# Patient Record
Sex: Female | Born: 1980 | Race: White | Hispanic: No | Marital: Married | Smoking: Never smoker
Health system: Southern US, Community
[De-identification: ages and names within clinical notes are randomized; demographics above are authoritative.]

## PROBLEM LIST (undated history)

## (undated) DIAGNOSIS — E079 Disorder of thyroid, unspecified: Secondary | ICD-10-CM

## (undated) DIAGNOSIS — N200 Calculus of kidney: Secondary | ICD-10-CM

## (undated) DIAGNOSIS — F419 Anxiety disorder, unspecified: Secondary | ICD-10-CM

## (undated) DIAGNOSIS — E669 Obesity, unspecified: Secondary | ICD-10-CM

## (undated) DIAGNOSIS — Z9889 Other specified postprocedural states: Secondary | ICD-10-CM

## (undated) DIAGNOSIS — M1990 Unspecified osteoarthritis, unspecified site: Secondary | ICD-10-CM

## (undated) DIAGNOSIS — E039 Hypothyroidism, unspecified: Secondary | ICD-10-CM

## (undated) DIAGNOSIS — R112 Nausea with vomiting, unspecified: Secondary | ICD-10-CM

## (undated) DIAGNOSIS — K8020 Calculus of gallbladder without cholecystitis without obstruction: Secondary | ICD-10-CM

## (undated) DIAGNOSIS — R002 Palpitations: Secondary | ICD-10-CM

## (undated) HISTORY — DX: Palpitations: R00.2

## (undated) HISTORY — DX: Obesity, unspecified: E66.9

## (undated) HISTORY — DX: Hypothyroidism, unspecified: E03.9

## (undated) HISTORY — DX: Anxiety disorder, unspecified: F41.9

## (undated) HISTORY — DX: Calculus of kidney: N20.0

## (undated) HISTORY — DX: Calculus of gallbladder without cholecystitis without obstruction: K80.20

## (undated) HISTORY — PX: DILATION AND CURETTAGE OF UTERUS: SHX78

## (undated) HISTORY — PX: GANGLION CYST EXCISION: SHX1691

## (undated) HISTORY — DX: Disorder of thyroid, unspecified: E07.9

---

## 2008-07-03 HISTORY — PX: OTHER SURGICAL HISTORY: SHX169

## 2008-07-03 HISTORY — PX: OOPHORECTOMY: SHX86

## 2013-03-03 HISTORY — PX: BREAST REDUCTION SURGERY: SHX8

## 2015-09-28 ENCOUNTER — Encounter: Payer: Self-pay | Admitting: *Deleted

## 2015-09-28 ENCOUNTER — Other Ambulatory Visit: Payer: Self-pay | Admitting: *Deleted

## 2015-10-01 ENCOUNTER — Encounter: Payer: Self-pay | Admitting: *Deleted

## 2015-10-01 ENCOUNTER — Encounter: Payer: Self-pay | Admitting: Cardiology

## 2015-10-01 ENCOUNTER — Ambulatory Visit (INDEPENDENT_AMBULATORY_CARE_PROVIDER_SITE_OTHER): Payer: BLUE CROSS/BLUE SHIELD | Admitting: Cardiology

## 2015-10-01 VITALS — BP 128/88 | HR 80 | Ht 63.0 in | Wt 166.0 lb

## 2015-10-01 DIAGNOSIS — Z136 Encounter for screening for cardiovascular disorders: Secondary | ICD-10-CM | POA: Diagnosis not present

## 2015-10-01 DIAGNOSIS — R002 Palpitations: Secondary | ICD-10-CM

## 2015-10-01 NOTE — Progress Notes (Signed)
Patient ID: Kathy Martin, female   DOB: 20-Jan-1981, 35 y.o.   MRN: [id number]     Clinical Summary Kathy Martin is a 35 y.o.female seen today as a new patient, she is referred by Dr Selinda Flavin.  1. Palpitations - 3 episodes over the 2 months - feeling of heart pounding, feels lightheaded, and feels anxious. Lasts 5-10 minutes, fatigued for the rest day - 2 episodes at work, 3rd epsisode at home while making a sandwich. Last episode about 2 week ago - 1 cup coffee in AM, no tea, no sodas, no energy drinks. 2-3 days a week, vodka x 1-2 shots.  - on xanax as needed, has long term anxiety.  - she reports normal blood work recently including normal TSH.     Past Medical History  Diagnosis Date  . Hypothyroidism   . Anxiety disorder   . Palpitations   . Palpitations   . Hypothyroidism   . Anxiety      No Known Allergies   Current Outpatient Prescriptions  Medication Sig Dispense Refill  . ALPRAZolam (XANAX) 0.25 MG tablet Take 0.25 mg by mouth daily as needed for anxiety.    . chlorpheniramine-HYDROcodone (TUSSIONEX PENNKINETIC ER) 10-8 MG/5ML SUER Take 5 mLs by mouth.    . lamoTRIgine (LAMICTAL) 100 MG tablet Take 1 tablet by mouth daily.  6  . levothyroxine (SYNTHROID, LEVOTHROID) 50 MCG tablet Take 50 mcg by mouth daily before breakfast.     No current facility-administered medications for this visit.     Past Surgical History  Procedure Laterality Date  . Breast reduction surgery  03/2013     No Known Allergies    Family History Patient denies any family history of heart disease.    Social History Kathy Martin reports that she has never smoked. She does not have any smokeless tobacco history on file. Kathy Martin has no alcohol history on file.   Review of Systems CONSTITUTIONAL: No weight loss, fever, chills, weakness or fatigue.  HEENT: Eyes: No visual loss, blurred vision, double vision or yellow sclerae.No hearing loss, sneezing, congestion, runny nose or  sore throat.  SKIN: No rash or itching.  CARDIOVASCULAR: per HPI RESPIRATORY: No shortness of breath, cough or sputum.  GASTROINTESTINAL: No anorexia, nausea, vomiting or diarrhea. No abdominal pain or blood.  GENITOURINARY: No burning on urination, no polyuria NEUROLOGICAL: No headache, dizziness, syncope, paralysis, ataxia, numbness or tingling in the extremities. No change in bowel or bladder control.  MUSCULOSKELETAL: No muscle, back pain, joint pain or stiffness.  LYMPHATICS: No enlarged nodes. No history of splenectomy.  PSYCHIATRIC: No history of depression or anxiety.  ENDOCRINOLOGIC: No reports of sweating, cold or heat intolerance. No polyuria or polydipsia.  Marland Kitchen   Physical Examination Filed Vitals:   10/01/15 0845  BP: 128/88  Pulse: 80   Filed Vitals:   10/01/15 0845  Height:  (1.6 m)  Weight: 166 lb (75.297 kg)    Gen: resting comfortably, no acute distress HEENT: no scleral icterus, pupils equal round and reactive, no palptable cervical adenopathy,  CV: RRR, no m/r/g, no jvd Resp: Clear to auscultation bilaterally GI: abdomen is soft, non-tender, non-distended, normal bowel sounds, no hepatosplenomegaly MSK: extremities are warm, no edema.  Skin: warm, no rash Neuro:  no focal deficits Psych: appropriate affect     Assessment and Plan  1. Palpitations - ekg in clinic shows normal sinus rhythm - we will obtain a 3 week event monitor to evaluate for any potential arrhythmias.  -  decrease caffeine and EtOH use.   F/u 6 weeks    Antoine PocheJonathan F. Rihanna Marseille, M.D.

## 2015-10-01 NOTE — Patient Instructions (Signed)
Your physician recommends that you schedule a follow-up appointment in: 6 weeks with Dr. Wyline MoodBranch  Your physician recommends that you continue on your current medications as directed. Please refer to the Current Medication list given to you today.  Your physician has recommended that you wear an event monitor FOR 21 DAYS. Event monitors are medical devices that record the heart's electrical activity. Doctors most often us these monitors to diagnose arrhythmias. Arrhythmias are problems with the speed or rhythm of the heartbeat. The monitor is a small, portable device. You can wear one while you do your normal daily activities. This is usually used to diagnose what is causing palpitations/syncope (passing out).  Thank you for choosing [facility]!!

## 2015-10-06 ENCOUNTER — Ambulatory Visit (INDEPENDENT_AMBULATORY_CARE_PROVIDER_SITE_OTHER): Payer: BLUE CROSS/BLUE SHIELD

## 2015-10-06 DIAGNOSIS — R002 Palpitations: Secondary | ICD-10-CM | POA: Diagnosis not present

## 2015-11-02 ENCOUNTER — Telehealth: Payer: Self-pay | Admitting: *Deleted

## 2015-11-02 NOTE — Telephone Encounter (Signed)
-----   Message from Antoine PocheJonathan F Branch, MD sent at 10/29/2015  3:54 PM EDT ----- Heart monitor shows no significant abnormal rhythms. We will discuss in detail at our follow up. How are her symptoms doing?  J BrancH MD

## 2015-11-02 NOTE — Telephone Encounter (Signed)
Pt called back and says she does still have episodes of where she cannot catch her breath, denies any chest pain or discomfort/dizziness. Pt confirmed appt for Friday.

## 2015-11-05 ENCOUNTER — Ambulatory Visit (INDEPENDENT_AMBULATORY_CARE_PROVIDER_SITE_OTHER): Payer: BLUE CROSS/BLUE SHIELD | Admitting: Cardiology

## 2015-11-05 ENCOUNTER — Encounter: Payer: Self-pay | Admitting: Cardiology

## 2015-11-05 ENCOUNTER — Ambulatory Visit: Payer: BLUE CROSS/BLUE SHIELD | Admitting: Cardiology

## 2015-11-05 VITALS — BP 108/73 | HR 74 | Ht 63.0 in | Wt 168.0 lb

## 2015-11-05 DIAGNOSIS — R002 Palpitations: Secondary | ICD-10-CM

## 2015-11-05 NOTE — Patient Instructions (Signed)
Your physician recommends that you schedule a follow-up appointment AS NEEDED WITH DR. BRANCH  Your physician recommends that you continue on your current medications as directed. Please refer to the Current Medication list given to you today.  Thank you for choosing [facility]!!   

## 2015-11-05 NOTE — Progress Notes (Signed)
Patient ID: Kathy Martin, female   DOB: 1981/06/03, 35 y.o.   MRN: [id number]     Clinical Summary Ms. Balzarini is a 35 y.o.female seen today for follow up of the following medical problems.   1. Palpitations - 3 episodes over the 2 months - feeling of heart pounding, feels lightheaded, and feels anxious. Lasts 5-10 minutes, fatigued for the rest day - 2 episodes at work, 3rd epsisode at home while making a sandwich. Last episode about 2 week ago - 1 cup coffee in AM, no tea, no sodas, no energy drinks. 2-3 days a week, vodka x 1-2 shots.  - on xanax as needed, has long term anxiety.  - she reports normal blood work recently including normal TSH.   - since last visit we completed a heart monitor which showed no significant arrhytymias. She has cut back on her EtOH intake and symptoms are improved.   Past Medical History  Diagnosis Date  . Hypothyroidism   . Anxiety disorder   . Palpitations   . Palpitations   . Hypothyroidism   . Anxiety      No Known Allergies   Current Outpatient Prescriptions  Medication Sig Dispense Refill  . ALPRAZolam (XANAX) 0.25 MG tablet Take 0.25 mg by mouth daily as needed for anxiety.    . lamoTRIgine (LAMICTAL) 100 MG tablet Take 1 tablet by mouth daily.  6  . levothyroxine (SYNTHROID, LEVOTHROID) 50 MCG tablet Take 50 mcg by mouth daily before breakfast.     No current facility-administered medications for this visit.     Past Surgical History  Procedure Laterality Date  . Breast reduction surgery  03/2013     No Known Allergies    Family history No family history of heart disease   Social History Ms. Winker reports that she has never smoked. She has never used smokeless tobacco. Ms. Mahnken has no alcohol history on file.   Review of Systems CONSTITUTIONAL: No weight loss, fever, chills, weakness or fatigue.  HEENT: Eyes: No visual loss, blurred vision, double vision or yellow sclerae.No hearing loss, sneezing,  congestion, runny nose or sore throat.  SKIN: No rash or itching.  CARDIOVASCULAR: per HPI RESPIRATORY: No shortness of breath, cough or sputum.  GASTROINTESTINAL: No anorexia, nausea, vomiting or diarrhea. No abdominal pain or blood.  GENITOURINARY: No burning on urination, no polyuria NEUROLOGICAL: No headache, dizziness, syncope, paralysis, ataxia, numbness or tingling in the extremities. No change in bowel or bladder control.  MUSCULOSKELETAL: No muscle, back pain, joint pain or stiffness.  LYMPHATICS: No enlarged nodes. No history of splenectomy.  PSYCHIATRIC: No history of depression or anxiety.  ENDOCRINOLOGIC: No reports of sweating, cold or heat intolerance. No polyuria or polydipsia.  Marland Kitchen   Physical Examination Filed Vitals:   11/05/15 1441  BP: 108/73  Pulse: 74   Filed Vitals:   11/05/15 1441  Height:  (1.6 m)  Weight: 168 lb (76.204 kg)    Gen: resting comfortably, no acute distress HEENT: no scleral icterus, pupils equal round and reactive, no palptable cervical adenopathy,  CV: RRR, no m/r/g, no jvd Resp: Clear to auscultation bilaterally GI: abdomen is soft, non-tender, non-distended, normal bowel sounds, no hepatosplenomegaly MSK: extremities are warm, no edema.  Skin: warm, no rash Neuro:  no focal deficits Psych: appropriate affect   Diagnostic Studies  10/2015 Event monitor  Telemetry strips show normal sinus rhythm  Reported symptoms correlate with normal sinus rhythm  No significant arrhythmias.  Assessment and Plan  1. Palpitations - no signifiacant arrhythmias on recent event monitor - symptoms improved with decrease EtOH intake - no further workup at this time, she will f/u as needed        Antoine PocheJonathan F. Aaima Gaddie, M.D.

## 2017-10-18 ENCOUNTER — Ambulatory Visit (INDEPENDENT_AMBULATORY_CARE_PROVIDER_SITE_OTHER): Payer: Self-pay

## 2017-10-18 ENCOUNTER — Encounter (INDEPENDENT_AMBULATORY_CARE_PROVIDER_SITE_OTHER): Payer: Self-pay | Admitting: Orthopaedic Surgery

## 2017-10-18 ENCOUNTER — Ambulatory Visit (INDEPENDENT_AMBULATORY_CARE_PROVIDER_SITE_OTHER): Payer: BLUE CROSS/BLUE SHIELD | Admitting: Orthopaedic Surgery

## 2017-10-18 VITALS — BP 117/77 | HR 70 | Ht 63.0 in | Wt 170.0 lb

## 2017-10-18 DIAGNOSIS — M79641 Pain in right hand: Secondary | ICD-10-CM

## 2017-10-18 DIAGNOSIS — M67441 Ganglion, right hand: Secondary | ICD-10-CM

## 2017-10-18 NOTE — Progress Notes (Signed)
Office Visit Note   Patient: Kathy Martin           Date of Birth: Oct 20, 1980           MRN: [id number] Visit Date: 10/18/2017              Requested by: Selinda FlavinHoward, Kevin, MD [address] [place], [place] [id number] PCP: Selinda FlavinHoward, Kevin, MD   Assessment & Plan: Visit Diagnoses:  1. Pain in right hand   2. Ganglion of right hand     Plan: Patient is a symptomatic dorsal hand ganglion present for greater than 10 years with increasing symptoms.  We discussed options of aspiration versus excision.  We discussed the potential for ganglion recurrence.  We also discussed aspiration which has a higher recurrence rate.  Her options and call if she would like to either proceed with aspiration or excision of the dorsal hand ganglion. Follow-Up Instructions: No follow-ups on file.   Orders:  Orders Placed This Encounter  Procedures  . XR Hand Complete Right   No orders of the defined types were placed in this encounter.     Procedures: No procedures performed   Clinical Data: No additional findings.   Subjective: Chief Complaint  Patient presents with  . Right Hand - Pain    HPI: 37 year old female seen with tender mass over the dorsum of her right hand present greater than 10 years.  It tends to get larger and smaller at times but is always present.  Last several months she has had increasing achiness.  Patient works as a Field seismologistsheriff deputy a does nd not recall any specific injury.  Review of Systems 14 point review of systems positive for history of hypothyroidism, anxiety, palpitations.  Associated neck pain no chills or fever.  Otherwise negative as it pertains HPI.  Patient has had previous C-section x2 right ovary removal, breast reduction surgery.  She takes thyroid supplement and fluoxetine 40 mg.   Objective: Vital Signs: BP 117/77   Pulse 70   Ht 5\' 3"  (1.6 m)   Wt 170 lb (77.1 kg)   BMI 30.11 kg/m   Physical Exam  Constitutional: She is oriented to person, place, and time.  She appears well-developed.  HENT:  Head: Normocephalic.  Right Ear: External ear normal.  Left Ear: External ear normal.  Eyes: Pupils are equal, round, and reactive to light.  Neck: No tracheal deviation present. No thyromegaly present.  Cardiovascular: Normal rate.  Pulmonary/Chest: Effort normal.  Abdominal: Soft.  Neurological: She is alert and oriented to person, place, and time.  Skin: Skin is warm and dry.  Psychiatric: She has a normal mood and affect. Her behavior is normal.    Ortho Exam patient has good cervical range of motion full shoulder range of motion full elbow extension.  Ganglion is present dorsally which is 8 to 10 mm round oval and fluid-filled by palpation.  Good grip no triggering of the digits sensation in the hand is normal.  Specialty Comments:  No specialty comments available.  Imaging: No results found.   PMFS History: There are no active problems to display for this patient.  Past Medical History:  Diagnosis Date  . Anxiety   . Anxiety   . Anxiety disorder   . Hypothyroidism   . Hypothyroidism   . Palpitations   . Palpitations   . Thyroid disease     History reviewed. No pertinent family history.  Past Surgical History:  Procedure Laterality Date  .  BREAST REDUCTION SURGERY  03/2013  . CESAREAN SECTION  2011, 2013   x2  . OOPHORECTOMY  2010   rt   Social History   Occupational History  . Not on file  Tobacco Use  . Smoking status: Never Smoker  . Smokeless tobacco: Never Used  Substance and Sexual Activity  . Alcohol use: Not on file  . Drug use: Not on file  . Sexual activity: Not on file

## 2017-10-22 ENCOUNTER — Encounter (INDEPENDENT_AMBULATORY_CARE_PROVIDER_SITE_OTHER): Payer: Self-pay | Admitting: Orthopaedic Surgery

## 2017-10-24 ENCOUNTER — Telehealth (INDEPENDENT_AMBULATORY_CARE_PROVIDER_SITE_OTHER): Payer: Self-pay | Admitting: Orthopaedic Surgery

## 2017-10-24 NOTE — Telephone Encounter (Signed)
Reviewed. She can call us and let us know.

## 2017-10-24 NOTE — Telephone Encounter (Signed)
FYI

## 2017-10-24 NOTE — Telephone Encounter (Signed)
[place]. She would like to proceed with excision of ganglion on her right hand. She [facility].  She has had surgery there before and  knows that they will require payment up front. She has not yet meet her $1000 deductible for the year.  Once [facility] gives her an estimate she will determine if she can do the surgery now or put if off until she is able to come up with the amount they'll want up front.

## 2017-10-25 NOTE — Telephone Encounter (Signed)
noted 

## 2017-11-05 DIAGNOSIS — M67431 Ganglion, right wrist: Secondary | ICD-10-CM | POA: Diagnosis not present

## 2017-11-15 ENCOUNTER — Encounter (INDEPENDENT_AMBULATORY_CARE_PROVIDER_SITE_OTHER): Payer: Self-pay | Admitting: Orthopaedic Surgery

## 2017-11-15 ENCOUNTER — Ambulatory Visit (INDEPENDENT_AMBULATORY_CARE_PROVIDER_SITE_OTHER): Payer: BLUE CROSS/BLUE SHIELD | Admitting: Orthopaedic Surgery

## 2017-11-15 VITALS — BP 125/85 | HR 68 | Ht 63.0 in | Wt 180.0 lb

## 2017-11-15 DIAGNOSIS — M67439 Ganglion, unspecified wrist: Secondary | ICD-10-CM | POA: Diagnosis not present

## 2017-11-15 NOTE — Progress Notes (Signed)
Follow-up dorsal wrist ganglion removal and removal of dorsal bone spur.  Suture removed Steri-Strips applied incision looks good no recurrence of the ganglion.  Work slip given no work from 11/05/2017 until 11/27/2017.  She can resume regular work without restrictions on 11/27/2017 office follow-up PRN.

## 2018-04-25 ENCOUNTER — Encounter (INDEPENDENT_AMBULATORY_CARE_PROVIDER_SITE_OTHER): Payer: Self-pay | Admitting: Orthopaedic Surgery

## 2018-04-25 ENCOUNTER — Ambulatory Visit (INDEPENDENT_AMBULATORY_CARE_PROVIDER_SITE_OTHER): Payer: BLUE CROSS/BLUE SHIELD | Admitting: Orthopaedic Surgery

## 2018-04-25 VITALS — BP 120/74 | HR 71 | Ht 63.0 in | Wt 180.0 lb

## 2018-04-25 DIAGNOSIS — M25531 Pain in right wrist: Secondary | ICD-10-CM

## 2018-04-25 NOTE — Progress Notes (Signed)
Office Visit Note   Patient: Kathy Martin           Date of Birth: [birth year]           MRN: [id number] Visit Date: 04/25/2018              Requested by: Selinda Flavin, MD [address] [place], [place] [address] PCP: Selinda Flavin, MD   Assessment & Plan: Visit Diagnoses:  1. Pain in right wrist     Plan: Patient has recurrent pain post ganglion excision right wrist dorsally.  This is at the [facility] joint.  She played sports when she was younger but does not recall any specific injury.  We will proceed with MRI scan to rule out articular cartilage damage/intra-articular problem which is causing recurrent pain.  Is bothering her on a daily basis.  She can use a wrist splint when she does household chores and use some Aleve 2 p.o. twice daily with meals.  Office follow-up after MRI.  Follow-Up Instructions: No follow-ups on file.   Orders:  No orders of the defined types were placed in this encounter.  No orders of the defined types were placed in this encounter.     Procedures: No procedures performed   Clinical Data: No additional findings.   Subjective: Chief Complaint  Patient presents with  . 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  She is had some recurrence of the exostosis not as prominent.  She has aching pain with turning twisting.  This is her dominant hand.  When she pulls her weapon out and has to turn or twist in place and place it in a box she has some pain.  She had pain with vacuuming.  She is has a splint at home but is not use it for activities where she does twisting repetitive flexion extension.  No numbness or tingling her hand.  No associated neck pain.   Review of Systems review of systems updated 14 point unchanged from 10/18/2017 other than as mentioned in HPI.   Objective: Vital Signs: BP 120/74   Pulse 71    Ht 5\' 3"  (1.6 m)   Wt 180 lb (81.6 kg)   BMI 31.89 kg/m   Physical Exam  Constitutional: She is oriented to person, place, and time. She appears well-developed.  HENT:  Head: Normocephalic.  Right Ear: External ear normal.  Left Ear: External ear normal.  Eyes: Pupils are equal, round, and reactive to light.  Neck: No tracheal deviation present. No thyromegaly present.  Cardiovascular: Normal rate.  Pulmonary/Chest: Effort normal.  Abdominal: Soft.  Neurological: She is alert and oriented to person, place, and time.  Skin: Skin is warm and dry.  Psychiatric: She has a normal mood and affect. Her behavior is normal.    Ortho Exam patient has well-healed transverse incision over the second, third CMC joint.  Extensor tendons have normal excursion.  Normal sensation of the hand carpal tunnel exam is negative.  Good cervical range of motion.  There may be a tiny recurrent ganglion present.  Small dorsal osteophyte recurrence not as prominent as for surgery.  She has some tenderness with palpation over this as well as over the dorsal wrist capsule.  No tenderness over the ulnar carpal joint.  TFCC CC is not tender.  Specialty Comments:  No specialty comments available.  Imaging: No results found.   PMFS History: There are no active problems to display for this patient.  Past Medical History:  Diagnosis Date  . Anxiety   . Anxiety   . Anxiety disorder   . Hypothyroidism   . Hypothyroidism   . Palpitations   . Palpitations   . Thyroid disease     No family history on file.  Past Surgical History:  Procedure Laterality Date  . BREAST REDUCTION SURGERY  03/2013  . CESAREAN SECTION  2011, 2013   x2  . OOPHORECTOMY  2010   rt   Social History   Occupational History  . Not on file  Tobacco Use  . Smoking status: Never Smoker  . Smokeless tobacco: Never Used  Substance and Sexual Activity  . Alcohol use: Not on file  . Drug use: Not on file  . Sexual activity: Not  on file

## 2018-04-25 NOTE — Addendum Note (Signed)
Addended by: Rogers Seeds on: 04/25/2018 09:49 AM   Modules accepted: Orders

## 2018-05-02 ENCOUNTER — Ambulatory Visit (INDEPENDENT_AMBULATORY_CARE_PROVIDER_SITE_OTHER): Payer: Self-pay

## 2018-05-02 ENCOUNTER — Encounter (INDEPENDENT_AMBULATORY_CARE_PROVIDER_SITE_OTHER): Payer: Self-pay | Admitting: Orthopaedic Surgery

## 2018-05-02 ENCOUNTER — Ambulatory Visit (INDEPENDENT_AMBULATORY_CARE_PROVIDER_SITE_OTHER): Payer: BLUE CROSS/BLUE SHIELD | Admitting: Orthopaedic Surgery

## 2018-05-02 VITALS — BP 136/88 | HR 79 | Ht 63.0 in | Wt 180.0 lb

## 2018-05-02 DIAGNOSIS — M4722 Other spondylosis with radiculopathy, cervical region: Secondary | ICD-10-CM | POA: Diagnosis not present

## 2018-05-02 DIAGNOSIS — M542 Cervicalgia: Secondary | ICD-10-CM

## 2018-05-02 MED ORDER — DIAZEPAM 5 MG PO TABS: ORAL_TABLET | ORAL | 0 refills | Status: DC

## 2018-05-05 ENCOUNTER — Encounter (INDEPENDENT_AMBULATORY_CARE_PROVIDER_SITE_OTHER): Payer: Self-pay | Admitting: Orthopaedic Surgery

## 2018-05-05 NOTE — Progress Notes (Signed)
Office Visit Note   Patient: Kathy Martin           Date of Birth: 11/20/1980           MRN: [id number] Visit Date: 05/02/2018              Requested by: Selinda Flavin, MD [address] [place], [place] [address] PCP: Selinda Flavin, MD   Assessment & Plan: Visit Diagnoses:  1. Neck pain   2. Other spondylosis with radiculopathy, cervical region     Plan: I recommend patient proceed with cervical MRI scan.  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  Office follow-up after MRI scan for review.  I gave patient a copy of the MRI scan of her wrist and we reviewed the images. Was present and included in the discussion.  Follow-Up Instructions: No follow-ups on file.   Orders:  Orders Placed This Encounter  Procedures  . XR Cervical Spine 2 or 3 views  . MR Cervical Spine w/o contrast   Meds ordered this encounter  Medications  . diazepam (VALIUM) 5 MG tablet    Sig: Take as directed prior to procedure.    Dispense:  3 tablet    Refill:  0      Procedures: No procedures performed   Clinical Data: No additional findings.   Subjective: Chief Complaint  Patient presents with  . Right Wrist - Follow-up    MRI Review Right Wrist    HPI 37 year old female returns with MRI scan obtained a right wrist post dorsal wrist ganglion excision.  She states she continues to have neck pain and pain that radiates into her shoulders.  Pain radiates from the dorsum of the wrist also into index and long finger.  She had dorsal spurs removed and MRI scan obtained demonstrates no evidence of recurrent ganglion.  Review of Systems 14 point systems updated unchanged from 10/18/2017 other than increasing neck  pain and radicular right arm pain.   Objective: Vital Signs: BP 136/88   Pulse 79   Ht 5\' 3"  (1.6 m)   Wt 180 lb (81.6 kg)   BMI 31.89 kg/m   Physical Exam  Constitutional: She is oriented to person, place, and time. She appears well-developed.  HENT:  Head: Normocephalic.  Right Ear: External ear normal.  Left Ear: External ear normal.  Eyes: Pupils are equal, round, and reactive to light.  Neck: No tracheal deviation present. No thyromegaly present.  Cardiovascular: Normal rate.  Pulmonary/Chest: Effort normal.  Abdominal: Soft.  Neurological: She is alert and oriented to person, place, and time.  Skin: Skin is warm and dry.  Psychiatric: She has a normal mood and affect. Her behavior is normal.    Ortho Exam patient has well-healed transverse scar over the carpal metacarpal joint without accompanying ganglion.  Significant brachial plexus tenderness on the right and negative on the left.  Positive Spurling to the right negative or meet.  No lower extremity clonus.  Lower extremity isolated weakness.  Ulnar nerve at the elbow median nerve at the forearm and wrist is normal.  Specialty Comments:  No specialty comments available.  Imaging: No results found.  PMFS History: There are no active problems to display for this patient.  Past Medical History:  Diagnosis Date  . Anxiety   . Anxiety   . Anxiety disorder   . Hypothyroidism   . Hypothyroidism   . Palpitations   . Palpitations   . Thyroid disease     No family history on file.  Past Surgical History:  Procedure Laterality Date  . BREAST REDUCTION SURGERY  03/2013  . CESAREAN SECTION  2011, 2013   x2  . OOPHORECTOMY  2010   rt   Social History   Occupational History  . Not on file  Tobacco Use  . Smoking status: Never Smoker  . Smokeless tobacco: Never Used  Substance and Sexual Activity  . Alcohol use: Not on file  . Drug use: Not on file  . Sexual activity: Not on file

## 2018-05-06 ENCOUNTER — Encounter: Payer: Self-pay | Admitting: Orthopaedic Surgery

## 2018-05-09 ENCOUNTER — Encounter (INDEPENDENT_AMBULATORY_CARE_PROVIDER_SITE_OTHER): Payer: Self-pay | Admitting: Orthopaedic Surgery

## 2018-05-09 ENCOUNTER — Ambulatory Visit (INDEPENDENT_AMBULATORY_CARE_PROVIDER_SITE_OTHER): Payer: BLUE CROSS/BLUE SHIELD | Admitting: Orthopaedic Surgery

## 2018-05-09 VITALS — BP 138/93 | HR 78 | Ht 63.0 in | Wt 180.0 lb

## 2018-05-09 DIAGNOSIS — M5020 Other cervical disc displacement, unspecified cervical region: Secondary | ICD-10-CM

## 2018-05-09 DIAGNOSIS — M4722 Other spondylosis with radiculopathy, cervical region: Secondary | ICD-10-CM

## 2018-05-09 MED ORDER — PREDNISONE 10 MG (21) PO TBPK: ORAL_TABLET | ORAL | 0 refills | Status: DC

## 2018-05-09 NOTE — Progress Notes (Addendum)
Office Visit Note   Patient: Kathy Martin           Date of Birth: 1981-03-14           MRN: [id number] Visit Date: 05/09/2018              Requested by: Selinda Flavin, MD [address] [place], [place] [address] PCP: Selinda Flavin, MD   Assessment & Plan: Visit Diagnoses:  1. Protrusion of cervical intervertebral disc   2. Other spondylosis with radiculopathy, cervical region     Plan: We will place patient on some home cervical traction.  Prednisone Dosepak.  We reviewed the MRI scan with patient and her mother who is with her today.  She has hemicord compression on the right side and nerve compression consistent with her symptoms.  We discussed 2 level cervical fusion C5-6, C6-7.  We discussed dysphasia dysphonia potential for pseudoarthrosis.  Risks of surgery was discussed.  Prednisone pack cervical traction.  If she is not getting relief she will call about surgical scheduling.  We discussed use of a soft collar after surgery and usual out of work time of 6 weeks.  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  Follow-Up Instructions: No follow-ups on file.   Orders:  No orders of the defined types were placed in this encounter.  Meds ordered this encounter  Medications  . predniSONE (STERAPRED UNI-PAK 21 TAB) 10 MG (21) TBPK tablet    Sig: Take as instructed    Dispense:  21 tablet    Refill:  0      Procedures: No procedures performed   Clinical Data: No additional findings.   Subjective: Chief Complaint  Patient presents with  . Neck - Follow-up    MRI Cervical Spine Review    HPI 37 year old female with persistent neck pain and right wrist and radial hand numbness.  She is had previous dorsal wrist excision of a ganglion and bone spur and has had no evidence of recurrence but has had persistent pain and numbness now with increased neck pain.  Patient is here with her husband has had her  cervical MRI scan which showed paracentral disc protrusion with right hemicord deformity worse at C5-6 then C6-7.  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  Review of Systems point review of systems updated.  History of hypothyroidism anxiety.  Surgeries include C-section x2 right ovary removal, breast reduction surgery.  Patient's on thyroid supplement.  14 point review of systems otherwise negative other than as mentioned in HPI with cervical spondylosis and radiculopathy right.   Objective: Vital Signs: BP (!) 138/93   Pulse 78   Ht 5\' 3"  (1.6 m)   Wt 180 lb (81.6 kg)   BMI 31.89 kg/m   Physical Exam  Constitutional: She is oriented to person, place, and time. She appears well-developed.  HENT:  Head: Normocephalic.  Right Ear: External ear normal.  Left Ear: External ear normal.  Eyes: Pupils are equal, round, and reactive to light.  Neck: No tracheal deviation present. No thyromegaly present.  Cardiovascular: Normal rate.  Pulmonary/Chest: Effort normal.  Abdominal: Soft.  Neurological: She is alert and oriented to person, place, and time.  Skin: Skin is warm and dry.  Psychiatric: She has a normal mood and affect. Her behavior is normal.    Ortho Exam patient has well-healed scar over dorsal spur removal right wrist without evidence of ganglion recurrence.  Positive Spurling on the right positive brachial plexus tenderness.  No lower extremity weakness no lower extremity clonus.  He has increased discomfort with cervical compression slight improvement with distraction.  Specialty Comments:  No specialty comments available.  Imaging: Cervical MRI 05/06/2008 shows C5-6 disc protrusion with right paracentral disc protrusion with compression right side of the cord.  Uncovertebral disc disease with moderate  foraminal narrowing worse on the right.  C6-7 shows broad-based right paracentral protrusion with slight ventral deformity of the right hemicord foramina is open.  Report read by Jae Dire   PMFS History: Patient Active Problem List   Diagnosis Date Noted  . Protrusion of cervical intervertebral disc 05/09/2018  . Other spondylosis with radiculopathy, cervical region 05/09/2018   Past Medical History:  Diagnosis Date  . Anxiety   . Anxiety   . Anxiety disorder   . Hypothyroidism   . Hypothyroidism   . Palpitations   . Palpitations   . Thyroid disease     No family history on file.  Past Surgical History:  Procedure Laterality Date  . BREAST REDUCTION SURGERY  03/2013  . CESAREAN SECTION  2011, 2013   x2  . OOPHORECTOMY  2010   rt   Social History   Occupational History  . Not on file  Tobacco Use  . Smoking status: Never Smoker  . Smokeless tobacco: Never Used  Substance and Sexual Activity  . Alcohol use: Not on file  . Drug use: Not on file  . Sexual activity: Not on file

## 2018-05-27 ENCOUNTER — Encounter (INDEPENDENT_AMBULATORY_CARE_PROVIDER_SITE_OTHER): Payer: Self-pay | Admitting: Orthopaedic Surgery

## 2018-05-31 NOTE — Pre-Procedure Instructions (Signed)
Kathy Martin  05/31/2018      [facility] - [place], [place] - [address] [address] [place] [address] Phone: [phone] Fax: [phone]  [facility] - [place], [place] - [address] [place] [address] Phone: [phone] Fax: [phone]    Your procedure is scheduled on Wednesday December 6.  Report to [facility] Admitting at 8:30 A.M.  Call this number if you have problems the morning of surgery:  [phone]   Remember:  Do not eat or drink after midnight.    Take these medicines the morning of surgery with A SIP OF WATER:   Levothyroxine (synthroid) Fluoxetine (Prozac) Xanax if needed Eye drops  7 days prior to surgery STOP taking any Aspirin(unless otherwise instructed by your surgeon), Aleve, Naproxen, Ibuprofen, Motrin, Advil, Goody's, BC's, all herbal medications, fish oil, and all vitamins     Do not wear jewelry, make-up or nail polish.  Do not wear lotions, powders, or perfumes, or deodorant.  Do not shave 48 hours prior to surgery.  Men may shave face and neck.  Do not bring valuables to the hospital.  [facility] is not responsible for any belongings or valuables.  Contacts, dentures or bridgework may not be worn into surgery.  Leave your suitcase in the car.  After surgery it may be brought to your room.  For patients admitted to the hospital, discharge time will be determined by your treatment team.  Patients discharged the day of surgery will not be allowed to drive home.    Special instructions:    [facility]- Preparing For Surgery  Before surgery, you can play an important role. Because skin is not sterile, your skin needs to be as free of germs as possible. You can reduce the number of germs on your skin by washing with CHG (chlorahexidine gluconate) Soap before surgery.  CHG is an antiseptic cleaner which kills germs and bonds with the skin to continue killing germs  even after washing.    Oral Hygiene is also important to reduce your risk of infection.  Remember - BRUSH YOUR TEETH THE MORNING OF SURGERY WITH YOUR REGULAR TOOTHPASTE  Please do not use if you have an allergy to CHG or antibacterial soaps. If your skin becomes reddened/irritated stop using the CHG.  Do not shave (including legs and underarms) for at least 48 hours prior to first CHG shower. It is OK to shave your face.  Please follow these instructions carefully.   1. Shower the NIGHT BEFORE SURGERY and the MORNING OF SURGERY with CHG.   2. If you chose to wash your hair, wash your hair first as usual with your normal shampoo.  3. After you shampoo, rinse your hair and body thoroughly to remove the shampoo.  4. Use CHG as you would any other liquid soap. You can apply CHG directly to the skin and wash gently with a scrungie or a clean washcloth.   5. Apply the CHG Soap to your body ONLY FROM THE NECK DOWN.  Do not use on open wounds or open sores. Avoid contact with your eyes, ears, mouth and genitals (private parts). Wash Face and genitals (private parts)  with your normal soap.  6. Wash thoroughly, paying special attention to the area where your surgery will be performed.  7. Thoroughly rinse your body with warm water from the neck down.  8. DO NOT shower/wash with your normal soap after using  and rinsing off the CHG Soap.  9. Pat yourself dry with a CLEAN TOWEL.  10. Wear CLEAN PAJAMAS to bed the night before surgery, wear comfortable clothes the morning of surgery  11. Place CLEAN SHEETS on your bed the night of your first shower and DO NOT SLEEP WITH PETS.    Day of Surgery:  Do not apply any deodorants/lotions.  Please wear clean clothes to the hospital/surgery center.   Remember to brush your teeth WITH YOUR REGULAR TOOTHPASTE.    Please read over the following fact sheets that you were given. Coughing and Deep Breathing, MRSA Information and Surgical Site Infection  Prevention

## 2018-06-03 ENCOUNTER — Ambulatory Visit (HOSPITAL_COMMUNITY)
Admission: RE | Admit: 2018-06-03 | Discharge: 2018-06-03 | Disposition: A | Discharge status: Home / Self Care | Payer: BLUE CROSS/BLUE SHIELD | Source: Ambulatory Visit | Attending: Orthopaedic Surgery | Admitting: Orthopaedic Surgery

## 2018-06-03 ENCOUNTER — Other Ambulatory Visit: Payer: Self-pay

## 2018-06-03 ENCOUNTER — Encounter (HOSPITAL_COMMUNITY)
Admission: RE | Admit: 2018-06-03 | Discharge: 2018-06-03 | Disposition: A | Discharge status: Home / Self Care | Payer: BLUE CROSS/BLUE SHIELD | Source: Ambulatory Visit | Attending: Orthopaedic Surgery | Admitting: Orthopaedic Surgery

## 2018-06-03 ENCOUNTER — Encounter (HOSPITAL_COMMUNITY): Payer: Self-pay

## 2018-06-03 DIAGNOSIS — Z01818 Encounter for other preprocedural examination: Secondary | ICD-10-CM | POA: Diagnosis not present

## 2018-06-03 DIAGNOSIS — M47812 Spondylosis without myelopathy or radiculopathy, cervical region: Secondary | ICD-10-CM

## 2018-06-03 HISTORY — DX: Unspecified osteoarthritis, unspecified site: M19.90

## 2018-06-03 HISTORY — DX: Nausea with vomiting, unspecified: R11.2

## 2018-06-03 HISTORY — DX: Other specified postprocedural states: Z98.890

## 2018-06-03 LAB — COMPREHENSIVE METABOLIC PANEL
ALT: 17 U/L (ref 0–44)
AST: 17 U/L (ref 15–41)
Albumin: 4.2 g/dL (ref 3.5–5.0)
Alkaline Phosphatase: 58 U/L (ref 38–126)
Anion gap: 10 (ref 5–15)
BUN: 10 mg/dL (ref 6–20)
CO2: 23 mmol/L (ref 22–32)
Calcium: 9.1 mg/dL (ref 8.9–10.3)
Chloride: 105 mmol/L (ref 98–111)
Creatinine, Ser: 1.12 mg/dL — ABNORMAL HIGH (ref 0.44–1.00)
GFR calc Af Amer: >60 mL/min (ref >60)
GFR calc non Af Amer: >60 mL/min (ref >60)
Glucose, Bld: 85 mg/dL (ref 70–99)
Potassium: 3.8 mmol/L (ref 3.5–5.1)
Sodium: 138 mmol/L (ref 135–145)
Total Bilirubin: 0.7 mg/dL (ref 0.3–1.2)
Total Protein: 6.9 g/dL (ref 6.5–8.1)

## 2018-06-03 LAB — CBC
HCT: 45 % (ref 36.0–46.0)
Hemoglobin: 13.9 g/dL (ref 12.0–15.0)
MCH: 29.6 pg (ref 26.0–34.0)
MCHC: 30.9 g/dL (ref 30.0–36.0)
MCV: 95.9 fL (ref 80.0–100.0)
Platelets: 254 10*3/uL (ref 150–400)
RBC: 4.69 MIL/uL (ref 3.87–5.11)
RDW: 12.5 % (ref 11.5–15.5)
WBC: 8.4 10*3/uL (ref 4.0–10.5)
nRBC: 0 % (ref 0.0–0.2)

## 2018-06-03 LAB — URINALYSIS, ROUTINE W REFLEX MICROSCOPIC
Bilirubin Urine: NEGATIVE
Glucose, UA: NEGATIVE mg/dL
Hgb urine dipstick: NEGATIVE
Ketones, ur: NEGATIVE mg/dL
LEUKOCYTES UA: NEGATIVE
Nitrite: NEGATIVE
Protein, ur: NEGATIVE mg/dL
Specific Gravity, Urine: 1.011 (ref 1.005–1.030)
pH: 7 (ref 5.0–8.0)

## 2018-06-03 LAB — MRSA PCR SCREENING: MRSA by PCR: NEGATIVE

## 2018-06-03 NOTE — Progress Notes (Signed)
PCP - Dr. Dimas AguasHoward- [facility]  Cardiologist - Denies  Chest x-ray - Denies  EKG - Denies  Stress Test - Denies  ECHO - Denies  Cardiac Cath - Denies  AICD- na PM- na LOOP- na  Sleep Study - Denies CPAP - Denies  LABS- 06/03/18: CBC, CMP, PCR, UA  ASA- Denies   Anesthesia- No  Pt denies having chest pain, sob, or fever at this time. All instructions explained to the pt, with a verbal understanding of the material. Pt agrees to go over the instructions while at home for a better understanding. The opportunity to ask questions was provided.

## 2018-06-06 NOTE — H&P (Signed)
Prior Versions: 1. Eldred MangesYates, Mark C, MD (Physician) at 05/09/2018 10:07 AM - Signed      Office Visit Note              Patient: Kathy KehrBrandi L Rendleman                                           Date of Birth: 09-Nov-1980                                                    MRN: [id number] Visit Date: 05/09/2018                                                                     Requested by: Selinda FlavinHoward, Kevin, MD [address] [place], [place] [id number] PCP: Selinda FlavinHoward, Kevin, MD   Assessment & Plan: Visit Diagnoses:  1. Protrusion of cervical intervertebral disc   2. Other spondylosis with radiculopathy, cervical region     Plan: We will place patient on some home cervical traction.  Prednisone Dosepak.  We reviewed the MRI scan with patient and her mother who is with her today.  She has hemicord compression on the right side and nerve compression consistent with her symptoms.  We discussed 2 level cervical fusion C5-6, C6-7.  We discussed dysphasia dysphonia potential for pseudoarthrosis.  Risks of surgery was discussed.  Prednisone pack cervical traction.  If she is not getting relief she will call about surgical scheduling.  We discussed use of a soft collar after surgery and usual out of work time of 6 weeks.  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  Follow-Up Instructions: No follow-ups on file.   Orders:  No orders of the defined types were placed in this encounter.      Meds ordered this encounter  Medications  . predniSONE (STERAPRED UNI-PAK 21 TAB) 10 MG (21) TBPK tablet    Sig: Take as instructed    Dispense:  21 tablet    Refill:  0      Procedures: No procedures performed   Clinical Data: No additional findings.   Subjective:     Chief Complaint  Patient presents with  . Neck - Follow-up    MRI Cervical Spine Review    HPI 37 year old female with persistent neck pain and right wrist  and radial hand numbness.  She is had previous dorsal wrist excision of a ganglion and bone spur and has had no evidence of recurrence but has had persistent pain and numbness now with increased neck pain.  Patient is here with her husband has had her cervical MRI scan which showed paracentral disc protrusion with right hemicord deformity worse at C5-6 then C6-7.  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  Review of Systems point review of systems updated.  History of hypothyroidism anxiety.  Surgeries include C-section x2 right ovary removal, breast reduction surgery.  Patient's on thyroid supplement.  14 point review of systems otherwise negative other than as mentioned in HPI with cervical spondylosis and radiculopathy right.   Objective: Vital Signs: BP (!) 138/93   Pulse 78   Ht 5\' 3"  (1.6 m)   Wt 180 lb (81.6 kg)   BMI 31.89 kg/m   Physical Exam  Constitutional: She is oriented to person, place, and time. She appears well-developed.  HENT:  Head: Normocephalic.  Right Ear: External ear normal.  Left Ear: External ear normal.  Eyes: Pupils are equal, round, and reactive to light.  Neck: No tracheal deviation present. No thyromegaly present.  Cardiovascular: Normal rate.  Pulmonary/Chest: Effort normal.  Abdominal: Soft.  Neurological: She is alert and oriented to person, place, and time.  Skin: Skin is warm and dry.  Psychiatric: She has a normal mood and affect. Her behavior is normal.    Ortho Exam patient has well-healed scar over dorsal spur removal right wrist without evidence of ganglion recurrence.  Positive Spurling on the right positive brachial plexus tenderness.  No lower extremity weakness no lower extremity clonus.  He has increased discomfort with cervical compression slight improvement with  distraction.  Specialty Comments:  No specialty comments available.  Imaging: Cervical MRI 05/06/2008 shows C5-6 disc protrusion with right paracentral disc protrusion with compression right side of the cord.  Uncovertebral disc disease with moderate foraminal narrowing worse on the right.  C6-7 shows broad-based right paracentral protrusion with slight ventral deformity of the right hemicord foramina is open.  Report read by Jae Dire   PMFS History:     Patient Active Problem List   Diagnosis Date Noted  . Protrusion of cervical intervertebral disc 05/09/2018  . Other spondylosis with radiculopathy, cervical region 05/09/2018       Past Medical History:  Diagnosis Date  . Anxiety   . Anxiety   . Anxiety disorder   . Hypothyroidism   . Hypothyroidism   . Palpitations   . Palpitations   . Thyroid disease     No family history on file.       Past Surgical History:  Procedure Laterality Date  . BREAST REDUCTION SURGERY  03/2013  . CESAREAN SECTION  2011, 2013   x2  . OOPHORECTOMY  2010   rt   Social History       Occupational History  . Not on file  Tobacco Use  . Smoking status: Never Smoker  . Smokeless tobacco: Never Used  Substance and Sexual Activity  . Alcohol use: Not on file  . Drug use: Not on file  . Sexual activity: Not on file          Consent Form by Default, Provider, MD at 05/09/2018 9:15 AM  Author: Default, Provider, MD Author Type: Physician Filed: 05/10/2018 9:10 AM  Note Status: Signed Cosign: Cosign Not Required Encounter Date: 05/09/2018  Editor: Bonnita Nasuti L        Scan on 05/10/2018 9:10 AM by Bonnita Nasuti L: A[facility]

## 2018-06-07 ENCOUNTER — Other Ambulatory Visit: Payer: Self-pay

## 2018-06-07 ENCOUNTER — Ambulatory Visit (HOSPITAL_COMMUNITY): Payer: BLUE CROSS/BLUE SHIELD | Admitting: Certified Registered Nurse Anesthetist

## 2018-06-07 ENCOUNTER — Encounter (HOSPITAL_COMMUNITY)
Admission: RE | Disposition: A | Discharge status: Home / Self Care | Payer: Self-pay | Source: Ambulatory Visit | Attending: Orthopaedic Surgery

## 2018-06-07 ENCOUNTER — Ambulatory Visit (HOSPITAL_COMMUNITY): Payer: BLUE CROSS/BLUE SHIELD

## 2018-06-07 ENCOUNTER — Encounter (HOSPITAL_COMMUNITY): Payer: Self-pay | Admitting: Certified Registered Nurse Anesthetist

## 2018-06-07 ENCOUNTER — Observation Stay (HOSPITAL_COMMUNITY)
Admission: RE | Admit: 2018-06-07 | Discharge: 2018-06-08 | Disposition: A | Discharge status: Home / Self Care | Payer: BLUE CROSS/BLUE SHIELD | Source: Ambulatory Visit | Attending: Orthopaedic Surgery | Admitting: Orthopaedic Surgery

## 2018-06-07 DIAGNOSIS — E039 Hypothyroidism, unspecified: Secondary | ICD-10-CM | POA: Insufficient documentation

## 2018-06-07 DIAGNOSIS — M5020 Other cervical disc displacement, unspecified cervical region: Secondary | ICD-10-CM | POA: Diagnosis present

## 2018-06-07 DIAGNOSIS — M50122 Cervical disc disorder at C5-C6 level with radiculopathy: Secondary | ICD-10-CM | POA: Diagnosis present

## 2018-06-07 DIAGNOSIS — Z79899 Other long term (current) drug therapy: Secondary | ICD-10-CM | POA: Diagnosis not present

## 2018-06-07 DIAGNOSIS — Z419 Encounter for procedure for purposes other than remedying health state, unspecified: Secondary | ICD-10-CM

## 2018-06-07 DIAGNOSIS — M50222 Other cervical disc displacement at C5-C6 level: Secondary | ICD-10-CM

## 2018-06-07 DIAGNOSIS — M4802 Spinal stenosis, cervical region: Secondary | ICD-10-CM | POA: Diagnosis not present

## 2018-06-07 DIAGNOSIS — M4722 Other spondylosis with radiculopathy, cervical region: Secondary | ICD-10-CM | POA: Diagnosis present

## 2018-06-07 DIAGNOSIS — M50223 Other cervical disc displacement at C6-C7 level: Secondary | ICD-10-CM | POA: Diagnosis not present

## 2018-06-07 HISTORY — PX: ANTERIOR CERVICAL DECOMP/DISCECTOMY FUSION: SHX1161

## 2018-06-07 LAB — POCT PREGNANCY, URINE: Preg Test, Ur: NEGATIVE

## 2018-06-07 SURGERY — ANTERIOR CERVICAL DECOMPRESSION/DISCECTOMY FUSION 2 LEVELS
Anesthesia: General

## 2018-06-07 MED ORDER — ACETAMINOPHEN 325 MG PO TABS: 650.0000 mg | ORAL_TABLET | ORAL | Status: DC | PRN

## 2018-06-07 MED ORDER — ONDANSETRON HCL 4 MG PO TABS: 4.0000 mg | ORAL_TABLET | Freq: Four times a day (QID) | ORAL | Status: DC | PRN

## 2018-06-07 MED ORDER — KETOROLAC TROMETHAMINE 30 MG/ML IJ SOLN
30.0000 mg | Freq: Once | INTRAMUSCULAR | Status: AC | PRN
  Administered 2018-06-07: 30 mg via INTRAVENOUS

## 2018-06-07 MED ORDER — FENTANYL CITRATE (PF) 250 MCG/5ML IJ SOLN
INTRAMUSCULAR | Status: AC
  Filled 2018-06-07: qty 5

## 2018-06-07 MED ORDER — FENTANYL CITRATE (PF) 250 MCG/5ML IJ SOLN
INTRAMUSCULAR | Status: DC | PRN
  Administered 2018-06-07: 150 ug via INTRAVENOUS
  Administered 2018-06-07 (×2): 50 ug via INTRAVENOUS

## 2018-06-07 MED ORDER — EPHEDRINE 5 MG/ML INJ
INTRAVENOUS | Status: AC
  Filled 2018-06-07: qty 10

## 2018-06-07 MED ORDER — POLYETHYLENE GLYCOL 3350 17 G PO PACK: 17.0000 g | PACK | Freq: Every day | ORAL | Status: DC

## 2018-06-07 MED ORDER — HYDROMORPHONE HCL 1 MG/ML IJ SOLN
INTRAMUSCULAR | Status: AC
  Filled 2018-06-07: qty 1

## 2018-06-07 MED ORDER — CEFAZOLIN SODIUM-DEXTROSE 2-4 GM/100ML-% IV SOLN
2.0000 g | INTRAVENOUS | Status: AC
  Administered 2018-06-07: 2 g via INTRAVENOUS

## 2018-06-07 MED ORDER — DEXAMETHASONE SODIUM PHOSPHATE 10 MG/ML IJ SOLN
INTRAMUSCULAR | Status: AC
  Filled 2018-06-07: qty 2

## 2018-06-07 MED ORDER — HYDROMORPHONE HCL 1 MG/ML IJ SOLN: 0.5000 mg | INTRAMUSCULAR | Status: DC | PRN

## 2018-06-07 MED ORDER — ACETAMINOPHEN 650 MG RE SUPP
650.0000 mg | RECTAL | Status: DC | PRN
  Filled 2018-06-07: qty 1

## 2018-06-07 MED ORDER — ONDANSETRON HCL 4 MG/2ML IJ SOLN
INTRAMUSCULAR | Status: DC | PRN
  Administered 2018-06-07: 4 mg via INTRAVENOUS

## 2018-06-07 MED ORDER — HYDROMORPHONE HCL 1 MG/ML IJ SOLN
0.2500 mg | INTRAMUSCULAR | Status: DC | PRN
  Administered 2018-06-07: 1 mg via INTRAVENOUS

## 2018-06-07 MED ORDER — MENTHOL 3 MG MT LOZG
1.0000 | LOZENGE | OROMUCOSAL | Status: DC | PRN
  Filled 2018-06-07: qty 9

## 2018-06-07 MED ORDER — 0.9 % SODIUM CHLORIDE (POUR BTL) OPTIME
TOPICAL | Status: DC | PRN
  Administered 2018-06-07: 1000 mL

## 2018-06-07 MED ORDER — BUPIVACAINE-EPINEPHRINE 0.25% -1:200000 IJ SOLN
INTRAMUSCULAR | Status: DC | PRN
  Administered 2018-06-07: 5 mL

## 2018-06-07 MED ORDER — SODIUM CHLORIDE 0.9% FLUSH
3.0000 mL | Freq: Two times a day (BID) | INTRAVENOUS | Status: DC
  Administered 2018-06-07: 3 mL via INTRAVENOUS

## 2018-06-07 MED ORDER — ROCURONIUM BROMIDE 10 MG/ML (PF) SYRINGE
PREFILLED_SYRINGE | INTRAVENOUS | Status: DC | PRN
  Administered 2018-06-07: 40 mg via INTRAVENOUS
  Administered 2018-06-07: 20 mg via INTRAVENOUS
  Administered 2018-06-07: 10 mg via INTRAVENOUS
  Administered 2018-06-07: 30 mg via INTRAVENOUS

## 2018-06-07 MED ORDER — ROCURONIUM BROMIDE 50 MG/5ML IV SOSY
PREFILLED_SYRINGE | INTRAVENOUS | Status: AC
  Filled 2018-06-07: qty 5

## 2018-06-07 MED ORDER — CEFAZOLIN SODIUM-DEXTROSE 2-4 GM/100ML-% IV SOLN
INTRAVENOUS | Status: AC
  Filled 2018-06-07: qty 100

## 2018-06-07 MED ORDER — ONDANSETRON HCL 4 MG/2ML IJ SOLN
INTRAMUSCULAR | Status: AC
  Filled 2018-06-07: qty 4

## 2018-06-07 MED ORDER — LIDOCAINE 2% (20 MG/ML) 5 ML SYRINGE
INTRAMUSCULAR | Status: AC
  Filled 2018-06-07: qty 5

## 2018-06-07 MED ORDER — MIDAZOLAM HCL 2 MG/2ML IJ SOLN
INTRAMUSCULAR | Status: DC | PRN
  Administered 2018-06-07: 2 mg via INTRAVENOUS

## 2018-06-07 MED ORDER — ONDANSETRON HCL 4 MG/2ML IJ SOLN: 4.0000 mg | Freq: Four times a day (QID) | INTRAMUSCULAR | Status: DC | PRN

## 2018-06-07 MED ORDER — METHOCARBAMOL 1000 MG/10ML IJ SOLN
500.0000 mg | Freq: Four times a day (QID) | INTRAVENOUS | Status: DC | PRN
  Filled 2018-06-07: qty 5

## 2018-06-07 MED ORDER — OXYCODONE HCL 5 MG PO TABS
5.0000 mg | ORAL_TABLET | ORAL | Status: DC | PRN
  Administered 2018-06-07 – 2018-06-08 (×5): 5 mg via ORAL
  Filled 2018-06-07 (×4): qty 1

## 2018-06-07 MED ORDER — METHOCARBAMOL 500 MG PO TABS
500.0000 mg | ORAL_TABLET | Freq: Four times a day (QID) | ORAL | Status: DC | PRN
  Administered 2018-06-07 – 2018-06-08 (×4): 500 mg via ORAL
  Filled 2018-06-07 (×3): qty 1

## 2018-06-07 MED ORDER — ALPRAZOLAM 0.25 MG PO TABS: 0.2500 mg | ORAL_TABLET | Freq: Two times a day (BID) | ORAL | Status: DC | PRN

## 2018-06-07 MED ORDER — MEPERIDINE HCL 50 MG/ML IJ SOLN: 6.2500 mg | INTRAMUSCULAR | Status: DC | PRN

## 2018-06-07 MED ORDER — PROPOFOL 10 MG/ML IV BOLUS
INTRAVENOUS | Status: DC | PRN
  Administered 2018-06-07: 200 mg via INTRAVENOUS

## 2018-06-07 MED ORDER — POLYETHYL GLYCOL-PROPYL GLYCOL 0.4-0.3 % OP SOLN: 1.0000 [drp] | Freq: Every day | OPHTHALMIC | Status: DC

## 2018-06-07 MED ORDER — FLUOXETINE HCL 20 MG PO CAPS
20.0000 mg | ORAL_CAPSULE | Freq: Every day | ORAL | Status: DC
  Administered 2018-06-08: 20 mg via ORAL
  Filled 2018-06-07: qty 1

## 2018-06-07 MED ORDER — LACTATED RINGERS IV SOLN
INTRAVENOUS | Status: DC
  Administered 2018-06-07: 10:00:00 via INTRAVENOUS

## 2018-06-07 MED ORDER — SCOPOLAMINE 1 MG/3DAYS TD PT72
MEDICATED_PATCH | TRANSDERMAL | Status: AC
  Filled 2018-06-07: qty 1

## 2018-06-07 MED ORDER — LEVOTHYROXINE SODIUM 100 MCG PO TABS
50.0000 ug | ORAL_TABLET | Freq: Every day | ORAL | Status: DC
  Administered 2018-06-08: 50 ug via ORAL
  Filled 2018-06-07: qty 1

## 2018-06-07 MED ORDER — OXYCODONE-ACETAMINOPHEN 5-325 MG PO TABS: 1.0000 | ORAL_TABLET | Freq: Four times a day (QID) | ORAL | 0 refills | Status: DC | PRN

## 2018-06-07 MED ORDER — SUGAMMADEX SODIUM 200 MG/2ML IV SOLN
INTRAVENOUS | Status: DC | PRN
  Administered 2018-06-07: 150 mg via INTRAVENOUS

## 2018-06-07 MED ORDER — SODIUM CHLORIDE 0.9 % IV SOLN: INTRAVENOUS | Status: DC

## 2018-06-07 MED ORDER — SODIUM CHLORIDE 0.9 % IV SOLN: 250.0000 mL | INTRAVENOUS | Status: DC

## 2018-06-07 MED ORDER — PHENOL 1.4 % MT LIQD
1.0000 | OROMUCOSAL | Status: DC | PRN
  Filled 2018-06-07: qty 177

## 2018-06-07 MED ORDER — SCOPOLAMINE 1 MG/3DAYS TD PT72
1.0000 | MEDICATED_PATCH | TRANSDERMAL | Status: DC
  Administered 2018-06-07: 1.5 mg via TRANSDERMAL
  Filled 2018-06-07: qty 1

## 2018-06-07 MED ORDER — SODIUM CHLORIDE 0.9% FLUSH: 3.0000 mL | INTRAVENOUS | Status: DC | PRN

## 2018-06-07 MED ORDER — MIDAZOLAM HCL 2 MG/2ML IJ SOLN
INTRAMUSCULAR | Status: AC
  Filled 2018-06-07: qty 2

## 2018-06-07 MED ORDER — DOCUSATE SODIUM 100 MG PO CAPS
100.0000 mg | ORAL_CAPSULE | Freq: Two times a day (BID) | ORAL | Status: DC
  Administered 2018-06-07 – 2018-06-08 (×2): 100 mg via ORAL
  Filled 2018-06-07 (×2): qty 1

## 2018-06-07 MED ORDER — HEMOSTATIC AGENTS (NO CHARGE) OPTIME
TOPICAL | Status: DC | PRN
  Administered 2018-06-07: 1 via TOPICAL

## 2018-06-07 MED ORDER — DEXAMETHASONE SODIUM PHOSPHATE 10 MG/ML IJ SOLN
INTRAMUSCULAR | Status: DC | PRN
  Administered 2018-06-07: 10 mg via INTRAVENOUS

## 2018-06-07 MED ORDER — OXYCODONE HCL 5 MG PO TABS
ORAL_TABLET | ORAL | Status: AC
  Filled 2018-06-07: qty 1

## 2018-06-07 MED ORDER — LIDOCAINE 2% (20 MG/ML) 5 ML SYRINGE
INTRAMUSCULAR | Status: DC | PRN
  Administered 2018-06-07: 100 mg via INTRAVENOUS

## 2018-06-07 MED ORDER — SODIUM CHLORIDE 0.9 % IV SOLN
INTRAVENOUS | Status: DC | PRN
  Administered 2018-06-07: 20 ug/min via INTRAVENOUS

## 2018-06-07 MED ORDER — BUPIVACAINE-EPINEPHRINE (PF) 0.25% -1:200000 IJ SOLN
INTRAMUSCULAR | Status: AC
  Filled 2018-06-07: qty 30

## 2018-06-07 MED ORDER — KETOROLAC TROMETHAMINE 30 MG/ML IJ SOLN
INTRAMUSCULAR | Status: AC
  Filled 2018-06-07: qty 1

## 2018-06-07 MED ORDER — CEFAZOLIN SODIUM-DEXTROSE 1-4 GM/50ML-% IV SOLN
1.0000 g | Freq: Three times a day (TID) | INTRAVENOUS | Status: AC
  Administered 2018-06-07 – 2018-06-08 (×2): 1 g via INTRAVENOUS
  Filled 2018-06-07 (×2): qty 50

## 2018-06-07 MED ORDER — CHLORHEXIDINE GLUCONATE 4 % EX LIQD: 60.0000 mL | Freq: Once | CUTANEOUS | Status: DC

## 2018-06-07 MED ORDER — METHOCARBAMOL 500 MG PO TABS
ORAL_TABLET | ORAL | Status: AC
  Filled 2018-06-07: qty 1

## 2018-06-07 MED ORDER — PROMETHAZINE HCL 25 MG/ML IJ SOLN: 6.2500 mg | INTRAMUSCULAR | Status: DC | PRN

## 2018-06-07 SURGICAL SUPPLY — 55 items
BENZOIN TINCTURE PRP APPL 2/3 (GAUZE/BANDAGES/DRESSINGS) ×3 IMPLANT
BIT DRILL SKYLINE 12MM (BIT) ×1 IMPLANT
BLADE CLIPPER SURG (BLADE) IMPLANT
BONE CERV LORDOTIC 14.5X12X6 (Bone Implant) ×3 IMPLANT
BONE CERV LORDOTIC 14.5X12X7 (Bone Implant) ×3 IMPLANT
BUR ROUND FLUTED 4 SOFT TCH (BURR) ×2 IMPLANT
BUR ROUND FLUTED 4MM SOFT TCH (BURR) ×1
CLOSURE WOUND 1/2 X4 (GAUZE/BANDAGES/DRESSINGS) ×1
COLLAR CERV LO CONTOUR FIRM DE (SOFTGOODS) ×3 IMPLANT
CORD BIPOLAR FORCEPS 12FT (ELECTRODE) ×3 IMPLANT
COVER SURGICAL LIGHT HANDLE (MISCELLANEOUS) ×3 IMPLANT
COVER WAND RF STERILE (DRAPES) ×3 IMPLANT
CRADLE DONUT ADULT HEAD (MISCELLANEOUS) ×3 IMPLANT
DRAPE C-ARM 42X72 X-RAY (DRAPES) ×3 IMPLANT
DRAPE HALF SHEET 40X57 (DRAPES) ×3 IMPLANT
DRAPE MICROSCOPE LEICA (MISCELLANEOUS) ×3 IMPLANT
DRILL BIT SKYLINE 12MM (BIT) ×2
DURAPREP 6ML APPLICATOR 50/CS (WOUND CARE) ×3 IMPLANT
ELECT COATED BLADE 2.86 ST (ELECTRODE) ×3 IMPLANT
ELECT REM PT RETURN 9FT ADLT (ELECTROSURGICAL) ×3
ELECTRODE REM PT RTRN 9FT ADLT (ELECTROSURGICAL) ×1 IMPLANT
EVACUATOR 1/8 PVC DRAIN (DRAIN) ×3 IMPLANT
GAUZE SPONGE 4X4 12PLY STRL (GAUZE/BANDAGES/DRESSINGS) ×3 IMPLANT
GLOVE BIO SURGEON STRL SZ7 (GLOVE) ×3 IMPLANT
GLOVE BIOGEL M 7.0 STRL (GLOVE) ×12 IMPLANT
GLOVE BIOGEL PI IND STRL 8 (GLOVE) ×2 IMPLANT
GLOVE BIOGEL PI INDICATOR 8 (GLOVE) ×4
GLOVE ORTHO TXT STRL SZ7.5 (GLOVE) ×6 IMPLANT
GOWN STRL REUS W/ TWL LRG LVL3 (GOWN DISPOSABLE) ×1 IMPLANT
GOWN STRL REUS W/ TWL XL LVL3 (GOWN DISPOSABLE) ×1 IMPLANT
GOWN STRL REUS W/TWL 2XL LVL3 (GOWN DISPOSABLE) ×3 IMPLANT
GOWN STRL REUS W/TWL LRG LVL3 (GOWN DISPOSABLE) ×2
GOWN STRL REUS W/TWL XL LVL3 (GOWN DISPOSABLE) ×2
HEAD HALTER (SOFTGOODS) ×3 IMPLANT
HEMOSTAT SURGICEL 2X14 (HEMOSTASIS) IMPLANT
KIT BASIN OR (CUSTOM PROCEDURE TRAY) ×3 IMPLANT
KIT TURNOVER KIT B (KITS) ×3 IMPLANT
MANIFOLD NEPTUNE II (INSTRUMENTS) IMPLANT
NEEDLE 25GX 5/8IN NON SAFETY (NEEDLE) ×3 IMPLANT
NS IRRIG 1000ML POUR BTL (IV SOLUTION) ×3 IMPLANT
PACK ORTHO CERVICAL (CUSTOM PROCEDURE TRAY) ×3 IMPLANT
PAD ARMBOARD 7.5X6 YLW CONV (MISCELLANEOUS) ×6 IMPLANT
PATTIES SURGICAL .5 X.5 (GAUZE/BANDAGES/DRESSINGS) IMPLANT
PIN TEMP SKYLINE THREADED (PIN) ×3 IMPLANT
PLATE TWO LEVEL SKYLINE 30MM (Plate) ×3 IMPLANT
RESTRAINT LIMB HOLDER UNIV (RESTRAINTS) IMPLANT
SCREW VARIABLE SELF TAP 12MM (Screw) ×18 IMPLANT
STRIP CLOSURE SKIN 1/2X4 (GAUZE/BANDAGES/DRESSINGS) ×2 IMPLANT
SURGIFLO W/THROMBIN 8M KIT (HEMOSTASIS) ×3 IMPLANT
SUT BONE WAX W31G (SUTURE) ×3 IMPLANT
SUT VIC AB 3-0 X1 27 (SUTURE) ×3 IMPLANT
SUT VICRYL 4-0 PS2 18IN ABS (SUTURE) ×6 IMPLANT
TOWEL OR 17X24 6PK STRL BLUE (TOWEL DISPOSABLE) ×3 IMPLANT
TOWEL OR 17X26 10 PK STRL BLUE (TOWEL DISPOSABLE) ×3 IMPLANT
TRAY FOLEY CATH SILVER 16FR (SET/KITS/TRAYS/PACK) IMPLANT

## 2018-06-07 NOTE — Anesthesia Preprocedure Evaluation (Signed)
Anesthesia Evaluation  Patient identified by MRN, date of birth, ID band Patient awake    Reviewed: Allergy & Precautions, NPO status , Patient's Chart, lab work & pertinent test results  History of Anesthesia Complications (+) PONV and history of anesthetic complications  Airway Mallampati: I       Dental no notable dental hx. (+) Teeth Intact   Pulmonary neg pulmonary ROS,    Pulmonary exam normal breath sounds clear to auscultation       Cardiovascular Normal cardiovascular exam Rhythm:Regular Rate:Normal     Neuro/Psych PSYCHIATRIC DISORDERS Anxiety negative neurological ROS     GI/Hepatic negative GI ROS, Neg liver ROS,   Endo/Other  Hypothyroidism   Renal/GU negative Renal ROS  negative genitourinary   Musculoskeletal  (+) Arthritis , Osteoarthritis,    Abdominal (+) + obese,   Peds  Hematology negative hematology ROS (+)   Anesthesia Other Findings   Reproductive/Obstetrics negative OB ROS                             Anesthesia Physical Anesthesia Plan  ASA: II  Anesthesia Plan: General   Post-op Pain Management:    Induction: Intravenous  PONV Risk Score and Plan: 4 or greater and Ondansetron, Dexamethasone, Scopolamine patch - Pre-op and Midazolam  Airway Management Planned: Oral ETT  Additional Equipment:   Intra-op Plan:   Post-operative Plan: Extubation in OR  Informed Consent: I have reviewed the patients History and Physical, chart, labs and discussed the procedure including the risks, benefits and alternatives for the proposed anesthesia with the patient or authorized representative who has indicated his/her understanding and acceptance.   Dental advisory given  Plan Discussed with: CRNA and Surgeon  Anesthesia Plan Comments:         Anesthesia Quick Evaluation

## 2018-06-07 NOTE — Interval H&P Note (Signed)
History and Physical Interval Note:  06/07/2018 9:24 AM  Kathy Martin  has presented today for surgery, with the diagnosis of C5-6, C6-7 SPONDYLOSIS STENOSIS  The various methods of treatment have been discussed with the patient and family. After consideration of risks, benefits and other options for treatment, the patient has consented to  Procedure(s): C5-6, C-6-7 ANTERIOR CERVICAL DECOMPRESSION/DISCECTOMY FUSION, ALLOGRAFT, PLATE (N/A) as a surgical intervention .  The patient's history has been reviewed, patient examined, no change in status, stable for surgery.  I have reviewed the patient's chart and labs.  Questions were answered to the patient's satisfaction.     Eldred MangesMark C Yates

## 2018-06-07 NOTE — Transfer of Care (Signed)
Immediate Anesthesia Transfer of Care Note  Patient: Kathy Martin  Procedure(s) Performed: C5-6, C-6-7 ANTERIOR CERVICAL DECOMPRESSION/DISCECTOMY FUSION, ALLOGRAFT, PLATE (N/A )  Patient Location: PACU  Anesthesia Type:General  Level of Consciousness: alert , oriented, drowsy and patient cooperative  Airway & Oxygen Therapy: Patient Spontanous Breathing  Post-op Assessment: Report given to RN, Post -op Vital signs reviewed and stable and Patient moving all extremities X 4  Post vital signs: Reviewed and stable  Last Vitals:  Vitals Value Taken Time  BP 104/63 06/07/2018 12:36 PM  Temp    Pulse 101 06/07/2018 12:38 PM  Resp 13 06/07/2018 12:38 PM  SpO2 97 % 06/07/2018 12:38 PM  Vitals shown include unvalidated device data.  Last Pain:  Vitals:   06/07/18 0842  TempSrc:   PainSc: 0-No pain         Complications: No apparent anesthesia complications

## 2018-06-07 NOTE — Progress Notes (Signed)
Orthopedic Tech Progress Note Patient Details:  Walker KehrBrandi L Rubey Jun 02, 1981 [id number]  Ortho Devices Type of Ortho Device: Soft collar Ortho Device/Splint Interventions: Application   Post Interventions Patient Tolerated: Well Instructions Provided: Care of device   Saul FordyceJennifer C Timithy Arons 06/07/2018, 3:23 PM

## 2018-06-07 NOTE — Interval H&P Note (Signed)
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 After consideration of risks, benefits and other options for treatment, the patient has consented to  Procedure(s): C5-6, C-6-7 ANTERIOR CERVICAL DECOMPRESSION/DISCECTOMY FUSION, ALLOGRAFT, PLATE (N/A) as a surgical intervention .  The patient's history has been reviewed, patient examined, no change in status, stable for surgery.  I have reviewed the patient's chart and labs.  Questions were answered to the patient's satisfaction.     Eldred MangesMark C Yates

## 2018-06-07 NOTE — Op Note (Signed)
Preop diagnosis: C5-6, C6-7 spondylosis with right paracentral disc protrusions and radiculopathy.  Postop diagnosis: Same  Procedure: C5-6, C6-7 anterior cervical discectomy and fusion allograft and plate.  Surgeon: Annell GreeningMark Yates, MD  Assistant: Zonia KiefJames Owens, PA-C medically necessary and present for the entire procedure  Anesthesia: General oral tracheal  Drains: 1 Hemovac neck.  Implants: Depuy Synthes skyline plate 30 mm with 12 mm screws x6. VG2 graft 6 mm at C5-6 and 7 mm at C6-7 level  Complications: None.  Procedure: After induction general anesthesia with intubation with glide scope due to anesthesia protocol had ultra traction was applied without weight arms were tucked the side restraints were applied for pulldown for visualization was C arm during the case as necessary.  Neck was prepped with DuraPrep there is squared with towel sterile skin marker and a prominent skin fold starting at the midline extending the left sterile female standard the head and thyroid sheet.  Timeout procedure was completed.  Ancef was given prophylactically.  Incision was made starting the midline extending the left platysma divided in line with the skin incision.  Blunt dissection down to the longus Coley was performed with prominent spur at C5-6 with short 25 needle placed.  Crosstable lateral C-arm confirmed appropriate level.  Self-retaining tractors were placed teeth blades right and left smooth blades.  Operative microscope was draped and brought in and disc material was removed.  Minimal use of the bur on the endplates was performed.  Uncovertebral joints were stripped with Cloward curettes.  There was disc protrusion central and right corresponding with the MRI scan with a pocket where the disc material was protruding.  Posterior longitudinal it was taken down with direct visualization of the cord.  Trial sizer showed that a 6 mm graft gave a good fit.  CRNA pulled on had ultra traction for insertion of the  graft countersunk 2mm.  Identical procedure repeated at the C6-7 level with again right paracentral disc protrusion without a pocket but disc protruding causing flattening of the epidural space and mild pressure and flattening of the dura.  This was completely decompressed uncovertebral joints were stripped 6 was slightly loose of submillimeter graft cortical cancellus graft was placed again with traction.  Countersunk 2 mm.  Anterior spurs were removed and 30 mm plate was selected held with a small spike checked under C arm adjusted and then sick screws were placed final spot pictures were taken confirming good position of screws and graft and all screws were locked in with a tiny screwdriver.  Hemovac drain placed within out technique on the left side.  Platysma closed with 3-0 Vicryl 4-0 Vicryl subcuticular closure tincture benzoin Steri-Strips 4 x 4's tape and soft collar was applied patient was neurologically intact in the recovery room.

## 2018-06-07 NOTE — Anesthesia Procedure Notes (Signed)
Procedure Name: Intubation Date/Time: 06/07/2018 10:48 AM Performed by: Burt Ekurner, Rohin Krejci Ashley, CRNA Pre-anesthesia Checklist: Patient identified, Emergency Drugs available, Suction available and Patient being monitored Patient Re-evaluated:Patient Re-evaluated prior to induction Oxygen Delivery Method: Circle system utilized Preoxygenation: Pre-oxygenation with 100% oxygen Induction Type: IV induction Ventilation: Mask ventilation without difficulty Laryngoscope Size: Glidescope and 3 Grade View: Grade I Tube type: Oral Tube size: 7.0 mm Number of attempts: 1 Airway Equipment and Method: Rigid stylet Placement Confirmation: positive ETCO2,  breath sounds checked- equal and bilateral and ETT inserted through vocal cords under direct vision Secured at: 21 cm Tube secured with: Tape Dental Injury: Teeth and Oropharynx as per pre-operative assessment

## 2018-06-07 NOTE — Discharge Instructions (Addendum)
Ok to shower 1 days postop.  Do not apply any creams or ointments to incision.  Do not remove steri-strips.  Leave dressing on.   No aggressive activity. Cervical collar must be on at all times even when showering.use extra collar for showering.   Do not bend or turn neck.    No driving, lifting, pushing, pulling

## 2018-06-08 DIAGNOSIS — M50122 Cervical disc disorder at C5-C6 level with radiculopathy: Secondary | ICD-10-CM | POA: Diagnosis not present

## 2018-06-08 NOTE — Progress Notes (Signed)
   Subjective: 1 Day Post-Op Procedure(s) (LRB): C5-6, C-6-7 ANTERIOR CERVICAL DECOMPRESSION/DISCECTOMY FUSION, ALLOGRAFT, PLATE (N/A) Patient reports pain as moderate.  Neck sore, no right arm pain, resolved since surgery.   Objective: Vital signs in last 24 hours: Temp:  [97.7 F (36.5 C)-98.2 F (36.8 C)] 98.2 F (36.8 C) (12/07 0729) Pulse Rate:  [68-105] 68 (12/07 0729) Resp:  [14-20] 16 (12/07 0729) BP: (104-120)/(63-84) 111/70 (12/07 0729) SpO2:  [94 %-99 %] 98 % (12/07 0729)  Intake/Output from previous day: 12/06 0701 - 12/07 0700 In: 915.4 [I.V.:800; IV Piggyback:115.4] Out: 210 [Drains:10; Blood:200] Intake/Output this shift: No intake/output data recorded.  No results for input(s): HGB in the last 72 hours. No results for input(s): WBC, RBC, HCT, PLT in the last 72 hours. No results for input(s): NA, K, CL, CO2, BUN, CREATININE, GLUCOSE, CALCIUM in the last 72 hours. No results for input(s): LABPT, INR in the last 72 hours.  Neurologically intact Dg Cervical Spine 2-3 Views  Result Date: 06/07/2018 CLINICAL DATA:  Cervical decompression EXAM: DG C-ARM 61-120 MIN; CERVICAL SPINE - 2-3 VIEW COMPARISON:  None. FLUOROSCOPY TIME:  Radiation Exposure Index (as provided by the fluoroscopic device): Not available If the device does not provide the exposure index: Fluoroscopy Time:  17 seconds Number of Acquired Images:  3 FINDINGS: Initial images demonstrate a surgical instrument anterior to the C5-6 interspace. Subsequent interbody fusion at C5-6 and C6-7 is seen with anterior fixation plate. IMPRESSION: Cervical fusion from C5-C7. Electronically Signed   By: Alcide CleverMark  Lukens M.D.   On: 06/07/2018 12:57   Dg C-arm 1-60 Min  Result Date: 06/07/2018 CLINICAL DATA:  Cervical decompression EXAM: DG C-ARM 61-120 MIN; CERVICAL SPINE - 2-3 VIEW COMPARISON:  None. FLUOROSCOPY TIME:  Radiation Exposure Index (as provided by the fluoroscopic device): Not available If the device does not  provide the exposure index: Fluoroscopy Time:  17 seconds Number of Acquired Images:  3 FINDINGS: Initial images demonstrate a surgical instrument anterior to the C5-6 interspace. Subsequent interbody fusion at C5-6 and C6-7 is seen with anterior fixation plate. IMPRESSION: Cervical fusion from C5-C7. Electronically Signed   By: Alcide CleverMark  Lukens M.D.   On: 06/07/2018 12:57    Assessment/Plan: 1 Day Post-Op Procedure(s) (LRB): C5-6, C-6-7 ANTERIOR CERVICAL DECOMPRESSION/DISCECTOMY FUSION, ALLOGRAFT, PLATE (N/A) Plan:  Drain removed. Dressing changed. Extra collar for showers. Office one week in [place] office next Thursday.   Rx for phenergan at her request in case she has post op nausea with pain meds.   Eldred MangesMark C Tangia Pinard 06/08/2018, 9:01 AM

## 2018-06-08 NOTE — Progress Notes (Signed)
Patient is discharged from room 3C07 at this time. Alert and in stable condition. IV site d/c'd and instructions read to patient and spouse with understanding verbalized. Left unit via wheelchair with all belongings at side. 

## 2018-06-11 ENCOUNTER — Encounter (HOSPITAL_COMMUNITY): Payer: Self-pay | Admitting: Orthopaedic Surgery

## 2018-06-13 ENCOUNTER — Encounter (INDEPENDENT_AMBULATORY_CARE_PROVIDER_SITE_OTHER): Payer: Self-pay | Admitting: Orthopaedic Surgery

## 2018-06-13 MED ORDER — METHOCARBAMOL 500 MG PO TABS: 500.0000 mg | ORAL_TABLET | Freq: Three times a day (TID) | ORAL | 0 refills | Status: DC | PRN

## 2018-06-17 NOTE — Anesthesia Postprocedure Evaluation (Signed)
Anesthesia Post Note  Patient: Walker KehrBrandi L Dekoning  Procedure(s) Performed: C5-6, C-6-7 ANTERIOR CERVICAL DECOMPRESSION/DISCECTOMY FUSION, ALLOGRAFT, PLATE (N/A )     Patient location during evaluation: PACU Anesthesia Type: General Level of consciousness: sedated Pain management: pain level controlled Vital Signs Assessment: post-procedure vital signs reviewed and stable Respiratory status: spontaneous breathing Cardiovascular status: stable Postop Assessment: no apparent nausea or vomiting Anesthetic complications: no    Last Vitals:  Vitals:   06/08/18 0403 06/08/18 0729  BP: 107/84 111/70  Pulse: 68 68  Resp: 16 16  Temp: 36.7 C 36.8 C  SpO2: 99% 98%    Last Pain:  Vitals:   06/08/18 0943  TempSrc:   PainSc: 3    Pain Goal: Patients Stated Pain Goal: 3 (06/08/18 0402)               Caren MacadamJohn F Louan Base Jr

## 2018-06-19 NOTE — Discharge Summary (Addendum)
Patient ID: Kathy Martin MRN: [id number] DOB/AGE: 03-05-1981 37 y.o.  Admit date: 06/07/2018 Discharge date: 06/08/18  Admission Diagnoses:  Active Problems:   Protrusion of cervical intervertebral disc   Other spondylosis with radiculopathy, cervical region   Cervical spinal stenosis   Discharge Diagnoses:  Active Problems:   Protrusion of cervical intervertebral disc   Other spondylosis with radiculopathy, cervical region   Cervical spinal stenosis  status post Procedure(s): C5-6, C-6-7 ANTERIOR CERVICAL DECOMPRESSION/DISCECTOMY FUSION, ALLOGRAFT, PLATE  Past Medical History:  Diagnosis Date  . Anxiety   . Anxiety   . Anxiety disorder   . Arthritis   . Hypothyroidism   . Hypothyroidism   . Palpitations   . Palpitations   . PONV (postoperative nausea and vomiting)   . Thyroid disease     Surgeries: Procedure(s): C5-6, C-6-7 ANTERIOR CERVICAL DECOMPRESSION/DISCECTOMY FUSION, ALLOGRAFT, PLATE on 40/03/8118   Consultants:   Discharged Condition: Improved  Hospital Course: Kathy Martin is an 37 y.o. female who was admitted 06/07/2018 for operative treatment of <principal problem not specified>. Patient failed conservative treatments (please see the history and physical for the specifics) and had severe unremitting pain that affects sleep, daily activities and work/hobbies. After pre-op clearance, the patient was taken to the operating room on 06/07/2018 and underwent  Procedure(s): C5-6, C-6-7 ANTERIOR CERVICAL DECOMPRESSION/DISCECTOMY FUSION, ALLOGRAFT, PLATE.    Patient was given perioperative antibiotics:  Anti-infectives (From admission, onward)   Start     Dose/Rate Route Frequency Ordered Stop   06/07/18 1830  ceFAZolin (ANCEF) IVPB 1 g/50 mL premix     1 g 100 mL/hr over 30 Minutes Intravenous Every 8 hours 06/07/18 1333 06/08/18 0431   06/07/18 0847  ceFAZolin (ANCEF) 2-4 GM/100ML-% IVPB    Note to Pharmacy:  Blair Promise   : cabinet override       06/07/18 0847 06/07/18 1030   06/07/18 0845  ceFAZolin (ANCEF) IVPB 2g/100 mL premix     2 g 200 mL/hr over 30 Minutes Intravenous On call to O.R. 06/07/18 0830 06/07/18 1040       Patient was given sequential compression devices and early ambulation to prevent DVT.   Patient benefited maximally from hospital stay and there were no complications. At the time of discharge, the patient was urinating/moving their bowels without difficulty, tolerating a regular diet, pain is controlled with oral pain medications and they have been cleared by PT/OT.   Recent vital signs: No data found.   Recent laboratory studies: No results for input(s): WBC, HGB, HCT, PLT, NA, K, CL, CO2, BUN, CREATININE, GLUCOSE, INR, CALCIUM in the last 72 hours.  Invalid input(s): PT, 2   Discharge Medications:   Allergies as of 06/08/2018   No Known Allergies     Medication List    STOP taking these medications   diazepam 5 MG tablet Commonly known as:  VALIUM   predniSONE 10 MG (21) Tbpk tablet Commonly known as:  STERAPRED UNI-PAK 21 TAB     TAKE these medications   ALPRAZolam 0.25 MG tablet Commonly known as:  XANAX Take 0.25 mg by mouth daily as needed for anxiety.   FLUoxetine 20 MG capsule Commonly known as:  PROZAC Take 20 mg by mouth daily.   levothyroxine 50 MCG tablet Commonly known as:  SYNTHROID, LEVOTHROID Take 50 mcg by mouth daily before breakfast.   oxyCODONE-acetaminophen 5-325 MG tablet Commonly known as:  PERCOCET Take 1-2 tablets by mouth every 6 (six) hours as needed for severe  pain.   SYSTANE ULTRA 0.4-0.3 % Soln Generic drug:  Polyethyl Glycol-Propyl Glycol Place 1 drop into both eyes daily.       Diagnostic Studies: Dg Chest 2 View  Result Date: 06/03/2018 CLINICAL DATA:  Pre-op respiratory exam for cervical spine fusion. EXAM: CHEST - 2 VIEW COMPARISON:  None. FINDINGS: The heart size and mediastinal contours are within normal limits. Both lungs are clear. The  visualized skeletal structures are unremarkable. IMPRESSION: No active cardiopulmonary disease. Electronically Signed   By: Myles RosenthalJohn  Stahl M.D.   On: 06/03/2018 15:52   Dg Cervical Spine 2-3 Views  Result Date: 06/07/2018 CLINICAL DATA:  Cervical decompression EXAM: DG C-ARM 61-120 MIN; CERVICAL SPINE - 2-3 VIEW COMPARISON:  None. FLUOROSCOPY TIME:  Radiation Exposure Index (as provided by the fluoroscopic device): Not available If the device does not provide the exposure index: Fluoroscopy Time:  17 seconds Number of Acquired Images:  3 FINDINGS: Initial images demonstrate a surgical instrument anterior to the C5-6 interspace. Subsequent interbody fusion at C5-6 and C6-7 is seen with anterior fixation plate. IMPRESSION: Cervical fusion from C5-C7. Electronically Signed   By: Alcide CleverMark  Lukens M.D.   On: 06/07/2018 12:57   Dg C-arm 1-60 Min  Result Date: 06/07/2018 CLINICAL DATA:  Cervical decompression EXAM: DG C-ARM 61-120 MIN; CERVICAL SPINE - 2-3 VIEW COMPARISON:  None. FLUOROSCOPY TIME:  Radiation Exposure Index (as provided by the fluoroscopic device): Not available If the device does not provide the exposure index: Fluoroscopy Time:  17 seconds Number of Acquired Images:  3 FINDINGS: Initial images demonstrate a surgical instrument anterior to the C5-6 interspace. Subsequent interbody fusion at C5-6 and C6-7 is seen with anterior fixation plate. IMPRESSION: Cervical fusion from C5-C7. Electronically Signed   By: Alcide CleverMark  Lukens M.D.   On: 06/07/2018 12:57      Follow-up Information    Schedule an appointment as soon as possible for a visit with Eldred MangesYates, Shemuel Harkleroad C, MD.   Specialty:  Orthopedic Surgery Why:  need return office visit one week postop thursday in [place] [facility].  Contact information: [address] [place] [id number] [phone]           Discharge Plan:  discharge to home  Disposition:     Signed: Zonia KiefJames Owens  06/19/2018, 12:53 PM

## 2018-06-20 ENCOUNTER — Ambulatory Visit (INDEPENDENT_AMBULATORY_CARE_PROVIDER_SITE_OTHER): Payer: BLUE CROSS/BLUE SHIELD | Admitting: Orthopaedic Surgery

## 2018-06-20 ENCOUNTER — Ambulatory Visit (INDEPENDENT_AMBULATORY_CARE_PROVIDER_SITE_OTHER): Payer: BLUE CROSS/BLUE SHIELD

## 2018-06-20 ENCOUNTER — Encounter (INDEPENDENT_AMBULATORY_CARE_PROVIDER_SITE_OTHER): Payer: Self-pay | Admitting: Orthopaedic Surgery

## 2018-06-20 VITALS — BP 95/74 | HR 75 | Ht 63.0 in | Wt 180.0 lb

## 2018-06-20 DIAGNOSIS — Z981 Arthrodesis status: Secondary | ICD-10-CM

## 2018-06-20 NOTE — Progress Notes (Signed)
   Post-Op Visit Note   Patient: Kathy Martin           Date of Birth: 13-Jan-1981           MRN: [id number] Visit Date: 06/20/2018 PCP: Selinda FlavinHoward, Kevin, MD   Assessment & Plan: Post 2 level cervical fusion.  She is taking some muscle relaxants with some soreness in her shoulder and trapezial region as expected.  X-rays look good.  Return 5 weeks.  Work slip given no work x5 weeks.  Chief Complaint:  Chief Complaint  Patient presents with  . Neck - Routine Post Op    06/07/18 C5-6, C6-7 ACDF   Visit Diagnoses:  1. Hx of fusion of cervical spine     Plan: Recheck 5 weeks.  Work slip given no work x5 weeks.  Relief of preop right arm radicular symptoms.  Follow-Up Instructions: No follow-ups on file.   Orders:  Orders Placed This Encounter  Procedures  . XR Cervical Spine 2 or 3 views   No orders of the defined types were placed in this encounter.   Imaging: No results found.  PMFS History: Patient Active Problem List   Diagnosis Date Noted  . Cervical spinal stenosis 06/07/2018  . Protrusion of cervical intervertebral disc 05/09/2018  . Other spondylosis with radiculopathy, cervical region 05/09/2018   Past Medical History:  Diagnosis Date  . Anxiety   . Anxiety   . Anxiety disorder   . Arthritis   . Hypothyroidism   . Hypothyroidism   . Palpitations   . Palpitations   . PONV (postoperative nausea and vomiting)   . Thyroid disease     No family history on file.  Past Surgical History:  Procedure Laterality Date  . ANTERIOR CERVICAL DECOMP/DISCECTOMY FUSION N/A 06/07/2018   Procedure: C5-6, C-6-7 ANTERIOR CERVICAL DECOMPRESSION/DISCECTOMY FUSION, ALLOGRAFT, PLATE;  Surgeon: Eldred MangesYates, Shadi Larner C, MD;  Location: [facility] OR;  Service: Orthopedics;  Laterality: N/A;  . BREAST REDUCTION SURGERY  03/2013  . CESAREAN SECTION  2011, 2013   x2  . DILATION AND CURETTAGE OF UTERUS    . GANGLION CYST EXCISION     Right  . OOPHORECTOMY  2010   rt   Social History    Occupational History  . Not on file  Tobacco Use  . Smoking status: Never Smoker  . Smokeless tobacco: Never Used  Substance and Sexual Activity  . Alcohol use: Yes    Alcohol/week: 0.0 standard drinks    Comment: occ  . Drug use: Never  . Sexual activity: Not on file

## 2018-06-21 ENCOUNTER — Encounter (INDEPENDENT_AMBULATORY_CARE_PROVIDER_SITE_OTHER): Payer: Self-pay | Admitting: Orthopaedic Surgery

## 2018-06-21 DIAGNOSIS — Z981 Arthrodesis status: Secondary | ICD-10-CM | POA: Insufficient documentation

## 2018-07-25 ENCOUNTER — Ambulatory Visit (INDEPENDENT_AMBULATORY_CARE_PROVIDER_SITE_OTHER): Payer: BLUE CROSS/BLUE SHIELD

## 2018-07-25 ENCOUNTER — Encounter (INDEPENDENT_AMBULATORY_CARE_PROVIDER_SITE_OTHER): Payer: Self-pay | Admitting: Orthopaedic Surgery

## 2018-07-25 ENCOUNTER — Ambulatory Visit (INDEPENDENT_AMBULATORY_CARE_PROVIDER_SITE_OTHER): Payer: BLUE CROSS/BLUE SHIELD | Admitting: Orthopaedic Surgery

## 2018-07-25 VITALS — BP 119/88 | HR 71 | Ht 63.0 in | Wt 180.0 lb

## 2018-07-25 DIAGNOSIS — Z981 Arthrodesis status: Secondary | ICD-10-CM

## 2018-07-25 NOTE — Progress Notes (Signed)
   Post-Op Visit Note   Patient: Kathy Martin           Date of Birth: 1980-08-18           MRN: [id number] Visit Date: 07/25/2018 PCP: Selinda Flavin, MD   Assessment & Plan:  Chief Complaint:  Chief Complaint  Patient presents with  . Neck - Follow-up    06/07/18 C5-6, C6-7 ACDF. Allograft, Plate   Visit Diagnoses:  1. Hx of fusion of cervical spine     Plan: Work slip given for work resumption on Monday.  She has some soreness in her shoulders improvement in her symptoms from surgery.  X-ray showed good incorporation.  Incision looks good she will apply lotion.  Follow-Up Instructions: Return in about 2 months (around 09/23/2018).   Orders:  Orders Placed This Encounter  Procedures  . XR Cervical Spine 2 or 3 views   No orders of the defined types were placed in this encounter.   Imaging: Xr Cervical Spine 2 Or 3 Views  Result Date: 07/25/2018 Lateral flexion-extension cervical spine x-rays are obtained and reviewed.  This shows 2 level cervical fusion C5-6 C6-7 without motion and progressive incorporation of the graft.  Good position of screws and plate. Impression: Satisfactory 2 level cervical fusion with progressive incorporation.   PMFS History: Patient Active Problem List   Diagnosis Date Noted  . Hx of fusion of cervical spine 06/21/2018  . Other spondylosis with radiculopathy, cervical region 05/09/2018   Past Medical History:  Diagnosis Date  . Anxiety   . Anxiety   . Anxiety disorder   . Arthritis   . Hypothyroidism   . Hypothyroidism   . Palpitations   . Palpitations   . PONV (postoperative nausea and vomiting)   . Thyroid disease     No family history on file.  Past Surgical History:  Procedure Laterality Date  . ANTERIOR CERVICAL DECOMP/DISCECTOMY FUSION N/A 06/07/2018   Procedure: C5-6, C-6-7 ANTERIOR CERVICAL DECOMPRESSION/DISCECTOMY FUSION, ALLOGRAFT, PLATE;  Surgeon: Eldred Manges, MD;  Location: [facility] OR;  Service: Orthopedics;   Laterality: N/A;  . BREAST REDUCTION SURGERY  03/2013  . CESAREAN SECTION  2011, 2013   x2  . DILATION AND CURETTAGE OF UTERUS    . GANGLION CYST EXCISION     Right  . OOPHORECTOMY  2010   rt   Social History   Occupational History  . Not on file  Tobacco Use  . Smoking status: Never Smoker  . Smokeless tobacco: Never Used  Substance and Sexual Activity  . Alcohol use: Yes    Alcohol/week: 0.0 standard drinks    Comment: occ  . Drug use: Never  . Sexual activity: Not on file

## 2018-09-05 ENCOUNTER — Inpatient Hospital Stay (INDEPENDENT_AMBULATORY_CARE_PROVIDER_SITE_OTHER): Payer: BLUE CROSS/BLUE SHIELD | Admitting: Orthopaedic Surgery

## 2018-11-19 ENCOUNTER — Other Ambulatory Visit: Payer: Self-pay

## 2018-11-19 ENCOUNTER — Encounter: Payer: Self-pay | Admitting: Internal Medicine

## 2018-11-19 ENCOUNTER — Ambulatory Visit: Payer: BLUE CROSS/BLUE SHIELD | Admitting: Internal Medicine

## 2018-11-19 DIAGNOSIS — J45991 Cough variant asthma: Secondary | ICD-10-CM | POA: Insufficient documentation

## 2018-11-19 MED ORDER — PANTOPRAZOLE SODIUM 40 MG PO TBEC: 40.0000 mg | DELAYED_RELEASE_TABLET | Freq: Every day | ORAL | 2 refills | Status: DC

## 2018-11-19 MED ORDER — MOMETASONE FURO-FORMOTEROL FUM 100-5 MCG/ACT IN AERO: 2.0000 | INHALATION_SPRAY | Freq: Two times a day (BID) | RESPIRATORY_TRACT | 0 refills | Status: DC

## 2018-11-19 MED ORDER — PREDNISONE 10 MG PO TABS: ORAL_TABLET | ORAL | 0 refills | Status: DC

## 2018-11-19 MED ORDER — MOMETASONE FURO-FORMOTEROL FUM 100-5 MCG/ACT IN AERO: INHALATION_SPRAY | RESPIRATORY_TRACT | 11 refills | Status: DC

## 2018-11-19 MED ORDER — FAMOTIDINE 20 MG PO TABS: ORAL_TABLET | ORAL | 11 refills | Status: DC

## 2018-11-19 NOTE — Progress Notes (Signed)
Kathy KehrBrandi L Onstad, female    DOB: 02/14/1981,    MRN: [id number]   Brief patient profile:  37 yowf never smoker last baby 2013 with drippy nose daily basis since and  150-160 baseline wt  and maintained this wt until around 2019 with rapid wt gain with progressive doe since and pattern of "recurrent bronchitis" dating back 2016 and bad episode of bronchitis end of Feb 2020 rx multiple ov's with abx / prednisone   Improves transiently  but flares again within days [facility] but never back to baseline so  referred 11/19/2018 by April Giles    History of Present Illness  11/19/2018  Pulmonary/ 1st office eval/Lucille Crichlow - last pred in march 2020  Chief Complaint  Patient presents with  . Pulmonary Consult    Referred April Giles NP Pt c/o SOB x 4 months. She states she tends to get bronchitis. Her cough is non prod.  Dyspnea:  Room to room  Cough: much better on prednisone as is the nasal drip/ could not tol singulair /worse with voice use  Sleep: min cough /on back flat one pillow /no am flares  SABA use: last saba > 18 h prior to OV  And no wixela 500  No obvious day to day or daytime variability or assoc excess/ purulent sputum or mucus plugs or hemoptysis or cp or chest tightness, subjective wheeze or overt sinus or hb symptoms.   Sleeping as above  without nocturnal  or early am exacerbation  of respiratory  c/o's or need for noct saba. Also denies any obvious fluctuation of symptoms with weather or environmental changes or other aggravating or alleviating factors except as outlined above   No unusual exposure hx or h/o childhood pna/ asthma or knowledge of premature birth.  Current Allergies, Complete Past Medical History, Past Surgical History, Family History, and Social History were reviewed in [facility] electronic medical record.  ROS  The following are not active complaints unless bolded Hoarseness, sore throat, dysphagia, dental problems, itching, sneezing,  nasal congestion  or discharge of excess watery mucus or purulent secretions, ear ache,   fever, chills, sweats, unintended wt loss or wt gain, classically pleuritic or exertional cp,  orthopnea pnd or arm/hand swelling  or leg swelling, presyncope, palpitations, abdominal pain, anorexia, nausea, vomiting, diarrhea  or change in bowel habits or change in bladder habits, change in stools or change in urine, dysuria, hematuria,  rash, arthralgias, visual complaints, headache, numbness, weakness or ataxia or problems with walking or coordination,  change in mood or  memory.           .  Past Medical History:  Diagnosis Date  . Anxiety   . Anxiety   . Anxiety disorder   . Arthritis   . Hypothyroidism   . Hypothyroidism   . Palpitations   . Palpitations   . PONV (postoperative nausea and vomiting)   . Thyroid disease     Outpatient Medications Prior to Visit  Medication Sig Dispense Refill  . ALPRAZolam (XANAX) 0.25 MG tablet Take 0.25 mg by mouth daily as needed for anxiety.    Marland Kitchen. FLUoxetine (PROZAC) 20 MG capsule Take 20 mg by mouth daily.     Marland Kitchen. levalbuterol (XOPENEX) 0.63 MG/3ML nebulizer solution 1 vial in neb every 4 hours as needed    . levothyroxine (SYNTHROID, LEVOTHROID) 50 MCG tablet Take 50 mcg by mouth daily before breakfast.    . Polyethyl Glycol-Propyl Glycol (SYSTANE ULTRA) 0.4-0.3 % SOLN Place  1 drop into both eyes daily.    Monte Fantasia INHUB 500-50 MCG/DOSE AEPB Inhale 1 puff into the lungs 2 (two) times a day.    . albuterol (VENTOLIN HFA) 108 (90 Base) MCG/ACT inhaler Inhale 2 puffs into the lungs every 4 (four) months.    . methocarbamol (ROBAXIN) 500 MG tablet Take 1 tablet (500 mg total) by mouth every 8 (eight) hours as needed for muscle spasms. (Patient not taking: Reported on 07/25/2018) 40 tablet 0  . oxyCODONE-acetaminophen (PERCOCET) 5-325 MG tablet Take 1-2 tablets by mouth every 6 (six) hours as needed for severe pain. (Patient not taking: Reported on 06/20/2018) 50 tablet 0       Objective:     BP 106/70 (BP Location: Left Arm, Cuff Size: Normal)   Pulse 70   Temp 98.1 F (36.7 C) (Oral)   Ht  (1.6 m)   Wt 196 lb 6.4 oz (89.1 kg)   SpO2 100%   BMI 34.79 kg/m   SpO2: 100 % RA  Hoarse amb wf nad/ predominantly pseudowheezing    HEENT: nl dentition, turbinates bilaterally, and oropharynx. Nl external ear canals without cough reflex   NECK :  without JVD/Nodes/TM/ nl carotid upstrokes bilaterally   LUNGS: no acc muscle use,  Nl contour chest which is clear to A and P bilaterally without cough on insp or exp maneuvers   CV:  RRR  no s3 or murmur or increase in P2, and no edema   ABD:  soft and nontender with nl inspiratory excursion in the supine position. No bruits or organomegaly appreciated, bowel sounds nl  MS:  Nl gait/ ext warm without deformities, calf tenderness, cyanosis or clubbing No obvious joint restrictions   SKIN: warm and dry without lesions    NEURO:  alert, approp, nl sensorium with  no motor or cerebellar deficits apparent.          Labs ordered 11/19/2018   Allergy profile      Assessment   Cough variant asthma Onset  2016 but much worse  since end Feb 2020 on wixella 500 - singulair intol - 11/19/2018  After extensive coaching inhaler device,  effectiveness =    90% > try dulera 100 and stop wixella  - Allergy profile 11/19/2018 >  Eos 191 /  IgE  Pending    DDX of  difficult airways management almost all start with A and  include Adherence, Ace Inhibitors, Acid Reflux, Active Sinus Disease, Alpha 1 Antitripsin deficiency, Anxiety masquerading as Airways dz,  ABPA,  Allergy(esp in young), Aspiration (esp in elderly), Adverse effects of meds,  Active smoking or vaping, A bunch of PE's (a small clot burden can't cause this syndrome unless there is already severe underlying pulm or vascular dz with poor reserve) plus two Bs  = Bronchiectasis and Beta blocker use..and one C= CHF   Adherence is always the initial "prime  suspect" and is a multilayered concern that requires a "trust but verify" approach in every patient - starting with knowing how to use medications, especially inhalers, correctly, keeping up with refills and understanding the fundamental difference between maintenance and prns vs those medications only taken for a very short course and then stopped and not refilled.  - see hfa teaching - return with all meds in hand using a trust but verify approach to confirm accurate Medication  Reconciliation The principal here is that until we are certain that the  patients are doing what we've asked, it makes  no sense to ask them to do more.   ? Adverse effects of dpi > change to low dose hfa ics/laba = dulera  ? Allergy > intol of singulair > send profile / Keep pred x 6 days on hand if needed pending f/u ov   Active sinus dz/ rhinitis > blow dulera out thru nose for now.  ? Acid (or non-acid) GERD > always difficult to exclude as up to 75% of pts in some series report no assoc GI/ Heartburn symptoms> rec max (24h)  acid suppression and diet restrictions/ reviewed and instructions given in writing.   ? Anxiety > usually at the bottom of this list of usual suspects but should be included on this pt's based on H and P and note already on psychotropics and may interfere with adherence and also interpretation of response or lack thereof to symptom management which can be quite subjective.    >>>> F/u in 6 weeks, sooner if needed       Total time devoted to counseling  > 50 % of initial 60 min office visit:  review case with pt/ device teaching which extended face to face time for this visit/  discussion of options/alternatives/ personally creating written customized instructions  in presence of pt  then going over those specific  Instructions directly with the pt including how to use all of the meds but in particular covering each new medication in detail and the difference between the maintenance=  "automatic" meds and the prns using an action plan format for the latter (If this problem/symptom => do that organization reading Left to right).  Please see AVS from this visit for a full list of these instructions which I personally wrote for this pt and  are unique to this visit.         Sandrea Hughs, MD 11/19/2018

## 2018-11-19 NOTE — Progress Notes (Signed)
Patient seen in the office today and instructed on use of Dulera 100.  Patient expressed understanding and demonstrated technique.  

## 2018-11-19 NOTE — Patient Instructions (Addendum)
Pantoprazole (protonix) 40 mg   Take  30-60 min before first meal of the day and Pepcid (famotidine)  20 mg one hours before  bedtime until return to office - this is the best way to tell whether stomach acid is contributing to your problem.    GERD (REFLUX)  is an extremely common cause of respiratory symptoms just like yours , many times with no obvious heartburn at all.    It can be treated with medication, but also with lifestyle changes including elevation of the head of your bed (ideally with 6 -8inch blocks under the headboard of your bed),  Smoking cessation, avoidance of late meals, excessive alcohol, and avoid fatty foods, chocolate, peppermint, colas, red wine, and acidic juices such as orange juice.  NO MINT OR MENTHOL PRODUCTS SO NO COUGH DROPS  USE SUGARLESS CANDY INSTEAD (Jolley ranchers or Stover's or Life Savers) or even ice chips will also do - the key is to swallow to prevent all throat clearing. NO OIL BASED VITAMINS - use powdered substitutes.  Avoid fish oil when coughing.  Stop wixela and start dulera 100 Take 2 puffs first thing in am and then another 2 puffs about 12 hours later.     If not improving > Prednisone 10 mg take  4 each am x 2 days,   2 each am x 2 days,  1 each am x 2 days and stop   Please remember to go to the lab department   for your tests - we will call you with the results when they are available.  Please schedule a follow up office visit in 6 weeks, call sooner if needed

## 2018-11-20 ENCOUNTER — Encounter: Payer: Self-pay | Admitting: Internal Medicine

## 2018-11-20 LAB — RESPIRATORY ALLERGY PROFILE REGION II ~~LOC~~
Allergen, A. alternata, m6: <0.1 kU/L
Allergen, Cedar tree, t12: <0.1 kU/L
Allergen, Comm Silver Birch, t9: <0.1 kU/L
Allergen, Cottonwood, t14: <0.1 kU/L
Allergen, D pternoyssinus,d7: <0.1 kU/L
Allergen, Mouse Urine Protein, e78: <0.1 kU/L
Allergen, Mulberry, t76: <0.1 kU/L
Allergen, Oak,t7: <0.1 kU/L
Allergen, P. notatum, m1: <0.1 kU/L
Aspergillus fumigatus, m3: <0.1 kU/L
Bermuda Grass: <0.1 kU/L
Box Elder IgE: <0.1 kU/L
CLADOSPORIUM HERBARUM (M2) IGE: <0.1 kU/L
COMMON RAGWEED (SHORT) (W1) IGE: <0.1 kU/L
Cat Dander: <0.1 kU/L
Class: 0
Class: 0
Class: 0
Class: 0
Class: 0
Class: 0
Class: 0
Class: 0
Class: 0
Class: 0
Class: 0
Class: 0
Class: 0
Class: 0
Class: 0
Class: 0
Class: 0
Class: 0
Class: 0
Class: 0
Class: 0
Class: 0
Class: 0
Class: 0
Cockroach: <0.1 kU/L
D. farinae: <0.1 kU/L
Dog Dander: 0.13 kU/L — ABNORMAL HIGH
Elm IgE: <0.1 kU/L
IgE (Immunoglobulin E), Serum: 23 kU/L (ref <=114)
Johnson Grass: <0.1 kU/L
Pecan/Hickory Tree IgE: <0.1 kU/L
Rough Pigweed  IgE: <0.1 kU/L
Sheep Sorrel IgE: <0.1 kU/L
Timothy Grass: <0.1 kU/L

## 2018-11-20 LAB — CBC WITH DIFFERENTIAL/PLATELET
Absolute Monocytes: 747 cells/uL (ref 200–950)
Basophils Absolute: 66 cells/uL (ref 0–200)
Basophils Relative: 0.8 %
Eosinophils Absolute: 191 cells/uL (ref 15–500)
Eosinophils Relative: 2.3 %
HCT: 40.3 % (ref 35.0–45.0)
Hemoglobin: 13.8 g/dL (ref 11.7–15.5)
Lymphs Abs: 2390 cells/uL (ref 850–3900)
MCH: 30.9 pg (ref 27.0–33.0)
MCHC: 34.2 g/dL (ref 32.0–36.0)
MCV: 90.2 fL (ref 80.0–100.0)
MPV: 10.6 fL (ref 7.5–12.5)
Monocytes Relative: 9 %
Neutro Abs: 4905 cells/uL (ref 1500–7800)
Neutrophils Relative %: 59.1 %
Platelets: 304 10*3/uL (ref 140–400)
RBC: 4.47 10*6/uL (ref 3.80–5.10)
RDW: 12.3 % (ref 11.0–15.0)
Total Lymphocyte: 28.8 %
WBC: 8.3 10*3/uL (ref 3.8–10.8)

## 2018-11-20 LAB — INTERPRETATION:

## 2018-11-20 NOTE — Assessment & Plan Note (Addendum)
Onset  2016 but much worse  since end Feb 2020 on wixella 500 - singulair intol - 11/19/2018  After extensive coaching inhaler device,  effectiveness =    90% > try dulera 100 and stop wixella  - Allergy profile 11/19/2018 >  Eos 191 /  IgE  Pending    DDX of  difficult airways management almost all start with A and  include Adherence, Ace Inhibitors, Acid Reflux, Active Sinus Disease, Alpha 1 Antitripsin deficiency, Anxiety masquerading as Airways dz,  ABPA,  Allergy(esp in young), Aspiration (esp in elderly), Adverse effects of meds,  Active smoking or vaping, A bunch of PE's (a small clot burden can't cause this syndrome unless there is already severe underlying pulm or vascular dz with poor reserve) plus two Bs  = Bronchiectasis and Beta blocker use..and one C= CHF   Adherence is always the initial "prime suspect" and is a multilayered concern that requires a "trust but verify" approach in every patient - starting with knowing how to use medications, especially inhalers, correctly, keeping up with refills and understanding the fundamental difference between maintenance and prns vs those medications only taken for a very short course and then stopped and not refilled.  - see hfa teaching - return with all meds in hand using a trust but verify approach to confirm accurate Medication  Reconciliation The principal here is that until we are certain that the  patients are doing what we've asked, it makes no sense to ask them to do more.   ? Adverse effects of dpi > change to low dose hfa ics/laba = dulera  ? Allergy > intol of singulair > send profile / Keep pred x 6 days on hand if needed pending f/u ov   Active sinus dz/ rhinitis > blow dulera out thru nose for now.  ? Acid (or non-acid) GERD > always difficult to exclude as up to 75% of pts in some series report no assoc GI/ Heartburn symptoms> rec max (24h)  acid suppression and diet restrictions/ reviewed and instructions given in writing.   ?  Anxiety > usually at the bottom of this list of usual suspects but should be included on this pt's based on H and P and note already on psychotropics and may interfere with adherence and also interpretation of response or lack thereof to symptom management which can be quite subjective.    F/u in 6 weeks, sooner if needed    Total time devoted to counseling  > 50 % of initial 60 min office visit:  review case with pt/ device teaching which extended face to face time for this visit/  discussion of options/alternatives/ personally creating written customized instructions  in presence of pt  then going over those specific  Instructions directly with the pt including how to use all of the meds but in particular covering each new medication in detail and the difference between the maintenance= "automatic" meds and the prns using an action plan format for the latter (If this problem/symptom => do that organization reading Left to right).  Please see AVS from this visit for a full list of these instructions which I personally wrote for this pt and  are unique to this visit.

## 2018-11-20 NOTE — Progress Notes (Signed)
Spoke with pt and notified of results per Dr. Wert. Pt verbalized understanding and denied any questions. 

## 2018-12-02 ENCOUNTER — Telehealth: Payer: Self-pay | Admitting: Internal Medicine

## 2018-12-02 NOTE — Telephone Encounter (Signed)
LVM for patient to return call to schedule an appt with NP this week as she is no better since last week.

## 2018-12-02 NOTE — Progress Notes (Signed)
Virtual Visit via Video Note  I connected with Kathy Martin on 12/03/18 at  9:30 AM EDT by a video enabled telemedicine application and verified that I am speaking with the correct person using two identifiers.  Location: Patient: Home Provider: Office - [facility] - [address], [address], [place], [place] [id number]  I discussed the limitations of evaluation and management by telemedicine and the availability of in person appointments. The patient expressed understanding and agreed to proceed.  I also discussed with the patient that there may be a patient responsible charge related to this service. The patient expressed understanding and agreed to proceed.  Patient consented to consult via telephone: Yes People present and their role in pt care: Pt   History of Present Illness:  38 year old female never smoker intially consulted with our office on 11/19/2018 for cough. Pt followed for cough variant asthma. Smoking history: Never Smoker  Maintenance: Dulera 100 Pt of Dr. Sherene SiresWert  Chief complaint: Cough    38 year old female initially consulted with our practice on 11/19/2018.  Patient was diagnosed with cough variant asthma.  Blood work from 11/19/2018 shows slight elevation to dog dander, normal IgE, normal eosinophilia.  Patient has not had formal pulmonary function testing before.  Patient continues to have a cyclical cough that has been occurring since 2016.  She did not find any improvement since recent prednisone taper.  She actually feels that her symptoms got worse.  Patient continues to have chest tightness, burning in the chest and feels fatigued.  She reports that the symptoms are close to her baseline what is been happening since 2016.  She does not use her rescue inhaler because she does not believe it helps.  She does not use her Xopenex nebs often because she feels it makes her jittery.  See cough ROS listed below.  Patient works full-time in a jail.  She reports that  sometimes while she is working in the jail she has worsened breathing-like episodes.  She reports that the clinical team of the jail has assessed her and reports that she does not have good air movement in the bases of her lungs.  Last chest x-ray results are from December 2019 are normal.  Patient reports she had a chest x-ray done in March/2020 that [facility] [place].  We do not have those records.  We have no recent CT imaging.  Patient reports that she is not currently pregnant.  She reports that her partner has had a vasectomy.   12/03/2018 - Cough ROS:   When to the symptoms start: Off and On since 2016 after Winter/2016 - PNU  How are you today: worse symptoms   Have you had fever/sore throat (first 5 to 7 days of URI) or Have you had cough/nasal congestion (10 to 14 days of URI) : Cough Have you used anything to treat the cough, as anything improved: nothing Is it a dry or wet cough: dry cough  Does the cough happen when your breathing or when you breathe out: unsure Other any triggers to your cough, or any aggravating factors: smells, humidity, cough worsens with menstrual cycle, worse in morning   Daily antihistamine: started xyzal last night - claritin didn't work, zyrtec made angry GERD treatment: Protonix / Pepcid  Singulair: none  Cough checklist (bolded indicates presence):  Adherence, acid reflux, ACE inhibitor, active / chronic sinus disease, active smoking, adverse effects of medications (amiodarone/Macrodantin/bb), alpha 1, allergies, aspiration, anxiety, ?  bronchiectasis, congestive heart failure (diastolic)   Observations/Objective:  11/19/2018-respiratory allergy panel-dog dander 0.13, IgE 23  11/19/2018-CBC with differential- eosinophils relative 2.3, eosinophils absolute 191  Assessment and Plan:  Cough variant asthma Assessment: No peripheral eosinophilia on blood work IgE stable Known allergies to dog, patient has a dog, dog does  not sleep with her Managed on Dulera 100 Patient still reporting cough but does not wheezing Triggers to cough are smells, humidity, worse in the morning as well as in the evenings  Plan: Continue Dulera 100 Pulmonary function test to be completed prior to next office visit Continue Xyzal Start chlor tabs at night for help with postnasal drip CT chest ordered Sinus CT ordered  Cough Assessment: Cough is been happening since pneumonia in 2016 Patient reports that she typically has worse symptoms during winter months, aggravating factors or smells, humidity, cough also seems to worsen during menstrual cycle, cough worse in the morning Patient reports recurrent chronic sinusitis symptoms requiring antibiotic treatment Patient with known allergies -elevated allergen to dog dander, patient has a dog at home Managed on Xyzal Managed on Protonix and Pepcid No previous PFTs  Plan: CT chest CT sinuses Continue Xyzal Continue Protonix and Pepcid Start chlor tabs at night for postnasal drip Pulmonary function testing to be ordered  Allergic rhinitis Assessment: Patient reporting increased postnasal drip Patient reporting increased nasal drainage  Plan: Start chlor tabs at night Sinus CT ordered Continue Xyzal    Follow Up Instructions:  Return in about 4 weeks (around 12/31/2018), or if symptoms worsen or fail to improve, for Follow up with Dr. Sherene Sires.    I discussed the assessment and treatment plan with the patient. The patient was provided an opportunity to ask questions and all were answered. The patient agreed with the plan and demonstrated an understanding of the instructions.   The patient was advised to call back or seek an in-person evaluation if the symptoms worsen or if the condition fails to improve as anticipated.  I provided 30 minutes of non-face-to-face time during this encounter.   Coral Ceo, NP

## 2018-12-02 NOTE — Telephone Encounter (Signed)
Called and spoke with patient regarding not feeling better since last ov with MW Pt feels the prednisone has not helped, but made her fell worse Pt has productive cough-clear, SOB and some wheezing Scheduled video visit with B.Mack- pt cannot leave work this week for appt Pt verbalized understanding Nothing further needed

## 2018-12-03 ENCOUNTER — Telehealth (INDEPENDENT_AMBULATORY_CARE_PROVIDER_SITE_OTHER): Payer: BC Managed Care – PPO | Admitting: Pulmonary Disease

## 2018-12-03 ENCOUNTER — Encounter: Payer: Self-pay | Admitting: Pulmonary Disease

## 2018-12-03 DIAGNOSIS — R05 Cough: Secondary | ICD-10-CM | POA: Insufficient documentation

## 2018-12-03 DIAGNOSIS — J45991 Cough variant asthma: Secondary | ICD-10-CM | POA: Diagnosis not present

## 2018-12-03 DIAGNOSIS — J309 Allergic rhinitis, unspecified: Secondary | ICD-10-CM | POA: Diagnosis not present

## 2018-12-03 DIAGNOSIS — R0602 Shortness of breath: Secondary | ICD-10-CM

## 2018-12-03 DIAGNOSIS — R059 Cough, unspecified: Secondary | ICD-10-CM

## 2018-12-03 DIAGNOSIS — R058 Other specified cough: Secondary | ICD-10-CM | POA: Insufficient documentation

## 2018-12-03 NOTE — Assessment & Plan Note (Signed)
Assessment: No peripheral eosinophilia on blood work IgE stable Known allergies to dog, patient has a dog, dog does not sleep with her Managed on Dulera 100 Patient still reporting cough but does not wheezing Triggers to cough are smells, humidity, worse in the morning as well as in the evenings  Plan: Continue Dulera 100 Pulmonary function test to be completed prior to next office visit Continue Xyzal Start chlor tabs at night for help with postnasal drip CT chest ordered Sinus CT ordered

## 2018-12-03 NOTE — Assessment & Plan Note (Signed)
Assessment: Cough is been happening since pneumonia in 2016 Patient reports that she typically has worse symptoms during winter months, aggravating factors or smells, humidity, cough also seems to worsen during menstrual cycle, cough worse in the morning Patient reports recurrent chronic sinusitis symptoms requiring antibiotic treatment Patient with known allergies -elevated allergen to dog dander, patient has a dog at home Managed on Xyzal Managed on Protonix and Pepcid No previous PFTs  Plan: CT chest CT sinuses Continue Xyzal Continue Protonix and Pepcid Start chlor tabs at night for postnasal drip Pulmonary function testing to be ordered

## 2018-12-03 NOTE — Assessment & Plan Note (Signed)
Assessment: Patient reporting increased postnasal drip Patient reporting increased nasal drainage  Plan: Start chlor tabs at night Sinus CT ordered Continue Xyzal

## 2018-12-03 NOTE — Patient Instructions (Addendum)
We will get you scheduled for CT chest without contrast  We will also get you set up for a sinus CT  Complete urine pregnancy test before completing CTs  We will need to get you set up for a pulmonary function test >>> This is an hour-long breathing test to further assess her lung functioning, you will need to be tested negative for COVID-19 prior to completing this test in office  Continue Dulera 100 >>> 2 puffs in the morning right when you wake up, rinse out your mouth after use, 12 hours later 2 puffs, rinse after use >>> Take this daily, no matter what >>> This is not a rescue inhaler   Please start taking chlorpheniramine (aka Chlor tabs) 4 mg tablet (1 to 2 tablets at night) for management of allergies and postnasal drip at night >>> This is an over-the-counter medication >>> This medication is sedating    Return in about 4 weeks (around 12/31/2018), or if symptoms worsen or fail to improve, for Follow up with Dr. Sherene Sires.   Coronavirus (COVID-19) Are you at risk?  Are you at risk for the Coronavirus (COVID-19)?  To be considered HIGH RISK for Coronavirus (COVID-19), you have to meet the following criteria:  . Traveled to [place], [place], [place], [place] or [place]; or in the [place] to [place], [place], [place], or [place]; and have fever, cough, and shortness of breath within the last 2 weeks of travel OR . Been in close contact with a person diagnosed with COVID-19 within the last 2 weeks and have fever, cough, and shortness of breath . IF YOU DO NOT MEET THESE CRITERIA, YOU ARE CONSIDERED LOW RISK FOR COVID-19.  What to do if you are HIGH RISK for COVID-19?  Marland Kitchen If you are having a medical emergency, call 911. . Seek medical care right away. Before you go to a doctor's office, urgent care or emergency department, call ahead and tell them about your recent travel, contact with someone diagnosed with COVID-19, and your symptoms. You should receive instructions  from your physician's office regarding next steps of care.  . When you arrive at healthcare provider, tell the healthcare staff immediately you have returned from visiting [place], [place], [place], [place] or [place]; or traveled in the [place] to [place], [place], [place], or [place]; in the last two weeks or you have been in close contact with a person diagnosed with COVID-19 in the last 2 weeks.   . Tell the health care staff about your symptoms: fever, cough and shortness of breath. . After you have been seen by a medical provider, you will be either: o Tested for (COVID-19) and discharged home on quarantine except to seek medical care if symptoms worsen, and asked to  - Stay home and avoid contact with others until you get your results (4-5 days)  - Avoid travel on public transportation if possible (such as bus, train, or airplane) or o Sent to the Emergency Department by EMS for evaluation, COVID-19 testing, and possible admission depending on your condition and test results.  What to do if you are LOW RISK for COVID-19?  Reduce your risk of any infection by using the same precautions used for avoiding the common cold or flu:  Marland Kitchen Wash your hands often with soap and warm water for at least 20 seconds.  If soap and water are not readily available, use an alcohol-based hand sanitizer with at least 60% alcohol.  . If  coughing or sneezing, cover your mouth and nose by coughing or sneezing into the elbow areas of your shirt or coat, into a tissue or into your sleeve (not your hands). . Avoid shaking hands with others and consider head nods or verbal greetings only. . Avoid touching your eyes, nose, or mouth with unwashed hands.  . Avoid close contact with people who are sick. . Avoid places or events with large numbers of people in one location, like concerts or sporting events. . Carefully consider travel plans you have or are making. . If you are planning any travel outside or inside  the [place], visit the CDC's Travelers' Health webpage for the latest health notices. . If you have some symptoms but not all symptoms, continue to monitor at home and seek medical attention if your symptoms worsen. . If you are having a medical emergency, call 911.   ADDITIONAL HEALTHCARE OPTIONS FOR PATIENTS  [facility] / e-Visit: [email or web address]         [facility]: [phone]  [facility]: [phone]                   [facility]: [phone]           It is flu season:   >>> Best ways to protect herself from the flu: Receive the yearly flu vaccine, practice good hand hygiene washing with soap and also using hand sanitizer when available, eat a nutritious meals, get adequate rest, hydrate appropriately   Please contact the office if your symptoms worsen or you have concerns that you are not improving.   Thank you for choosing [facility] for your healthcare, and for allowing us to partner with you on your healthcare journey. I am thankful to be able to provide care to you today.   Elisha HeadlandBrian  FNP-C

## 2018-12-04 ENCOUNTER — Telehealth: Payer: Self-pay | Admitting: Internal Medicine

## 2018-12-04 NOTE — Telephone Encounter (Signed)
I have got the CTs scheduled for 6/19 @ [facility], check in for both by 1:45 PM.  Pt aware of appts & location.  Pt would like to double check on the pregnancy test.  Pt feels she shouldn't have to complete it.Marland KitchenMarland Kitchen

## 2018-12-04 NOTE — Telephone Encounter (Signed)
Called and spoke with Patient.  Brian's recommendations given to Patient. Patient stated she has not had recent sex, and knows there is no way she is pregnant at this time.  Explained that if she has CT without pregnancy test, she is taking on her own accord. [facility] advised against CT without pregnancy test.  Patient stated understanding. Nothing further at this time.

## 2018-12-04 NOTE — Telephone Encounter (Signed)
I will defer to Arlys John on this, he is here tomorrow and CT isn't scheduled until 6/19. Will forward to him. Thanks

## 2018-12-04 NOTE — Telephone Encounter (Signed)
Beth please forward to triage.     Yes she needs the pregnancy test. If she refuses to receive that and completes the ct on her own accord that is her decision. She is sexually actively and at a conceivable age. Her birth control is her partners vasectomy.   I clearly have a pregnancy test ordered. I also discussed this with the patient.   Elisha Headland FNP

## 2018-12-04 NOTE — Telephone Encounter (Signed)
Please see Lorrene Reid message

## 2018-12-04 NOTE — Telephone Encounter (Signed)
Kathy Martin/Kathy Martin - I have scheduled patient for 12/31/2018. Pt lives in [place] and would like to know if she can do the COVID testing at a local testing site there and have results sent to [place]?    PCC's- Pt questioned date of her CT's she is going on vacation next week and would like to make sure that the scans are done prior to her visit with Dr. Sherene Sires on 12/31/2018   Thanks -PR

## 2018-12-04 NOTE — Telephone Encounter (Signed)
BW do you know if this patient needs pregnancy testing before CT scan. It was ordered by BM on yesterday and patient doesn't think she needs it.

## 2018-12-05 NOTE — Telephone Encounter (Signed)
Thank you, agreed.   Kathy Martin

## 2018-12-11 NOTE — Progress Notes (Signed)
Chart and office note reviewed in detail  > agree with a/p as outlined    

## 2018-12-20 ENCOUNTER — Other Ambulatory Visit: Payer: Self-pay

## 2018-12-20 ENCOUNTER — Ambulatory Visit (HOSPITAL_COMMUNITY)
Admission: RE | Admit: 2018-12-20 | Discharge: 2018-12-20 | Disposition: A | Discharge status: Home / Self Care | Payer: BC Managed Care – PPO | Source: Ambulatory Visit | Attending: Pulmonary Disease | Admitting: Pulmonary Disease

## 2018-12-20 DIAGNOSIS — R05 Cough: Secondary | ICD-10-CM | POA: Diagnosis present

## 2018-12-20 DIAGNOSIS — R0602 Shortness of breath: Secondary | ICD-10-CM | POA: Diagnosis present

## 2018-12-20 DIAGNOSIS — R059 Cough, unspecified: Secondary | ICD-10-CM

## 2018-12-23 NOTE — Progress Notes (Signed)
Spoke with pt and notified of results per Brian Mack, NP. Pt verbalized understanding and denied any questions. 

## 2018-12-23 NOTE — Progress Notes (Signed)
Spoke with pt and notified of results per Brian Mack. Pt verbalized understanding and denied any questions. 

## 2018-12-23 NOTE — Progress Notes (Signed)
CT maxillofacial shows no acute abnormalities or chronic sinusitis.  This is good news.  Keep follow-up with our office.  Wyn Quaker, FNP

## 2018-12-23 NOTE — Progress Notes (Signed)
Chest CT negative for any acute abnormalities.  This is good news.  Keep scheduled follow-up with our office.  No further changes at this time.  Wyn Quaker, FNP

## 2018-12-24 ENCOUNTER — Other Ambulatory Visit: Payer: Self-pay | Admitting: Internal Medicine

## 2018-12-24 NOTE — Telephone Encounter (Signed)
Called and spoke to patient.  Scheduled COVID testing for the [facility] site for 6/26 at 1:20 pm.  Nothing further needed.

## 2018-12-27 ENCOUNTER — Other Ambulatory Visit: Payer: Self-pay

## 2018-12-27 ENCOUNTER — Other Ambulatory Visit (HOSPITAL_COMMUNITY)
Admission: RE | Admit: 2018-12-27 | Discharge: 2018-12-27 | Disposition: A | Discharge status: Home / Self Care | Payer: BC Managed Care – PPO | Source: Ambulatory Visit | Attending: Internal Medicine | Admitting: Internal Medicine

## 2018-12-27 DIAGNOSIS — Z1159 Encounter for screening for other viral diseases: Secondary | ICD-10-CM | POA: Diagnosis not present

## 2018-12-28 LAB — NOVEL CORONAVIRUS, NAA (HOSP ORDER, SEND-OUT TO REF LAB; TAT 18-24 HRS): SARS-CoV-2, NAA: NOT DETECTED

## 2018-12-31 ENCOUNTER — Ambulatory Visit: Payer: BLUE CROSS/BLUE SHIELD | Admitting: Internal Medicine

## 2018-12-31 ENCOUNTER — Encounter: Payer: Self-pay | Admitting: Internal Medicine

## 2018-12-31 ENCOUNTER — Ambulatory Visit (INDEPENDENT_AMBULATORY_CARE_PROVIDER_SITE_OTHER): Payer: BC Managed Care – PPO | Admitting: Internal Medicine

## 2018-12-31 ENCOUNTER — Ambulatory Visit: Payer: BC Managed Care – PPO | Admitting: Internal Medicine

## 2018-12-31 ENCOUNTER — Other Ambulatory Visit: Payer: Self-pay

## 2018-12-31 DIAGNOSIS — J45991 Cough variant asthma: Secondary | ICD-10-CM | POA: Diagnosis not present

## 2018-12-31 DIAGNOSIS — R059 Cough, unspecified: Secondary | ICD-10-CM

## 2018-12-31 DIAGNOSIS — R058 Other specified cough: Secondary | ICD-10-CM

## 2018-12-31 DIAGNOSIS — R0609 Other forms of dyspnea: Secondary | ICD-10-CM

## 2018-12-31 DIAGNOSIS — R05 Cough: Secondary | ICD-10-CM

## 2018-12-31 LAB — PULMONARY FUNCTION TEST
DL/VA % pred: 119 %
DL/VA: 5.4 ml/min/mmHg/L
DLCO cor % pred: 132 %
DLCO cor: 28.31 ml/min/mmHg
DLCO unc % pred: 134 %
DLCO unc: 28.65 ml/min/mmHg
FEF 25-75 Post: 3.34 L/sec
FEF 25-75 Pre: 3.2 L/sec
FEF2575-%Change-Post: 4 %
FEF2575-%Pred-Post: 105 %
FEF2575-%Pred-Pre: 100 %
FEV1-%Change-Post: -1 %
FEV1-%Pred-Post: 100 %
FEV1-%Pred-Pre: 101 %
FEV1-Post: 3 L
FEV1-Pre: 3.04 L
FEV1FVC-%Change-Post: 1 %
FEV1FVC-%Pred-Pre: 99 %
FEV6-%Change-Post: -2 %
FEV6-%Pred-Post: 100 %
FEV6-%Pred-Pre: 103 %
FEV6-Post: 3.58 L
FEV6-Pre: 3.67 L
FEV6FVC-%Pred-Post: 101 %
FEV6FVC-%Pred-Pre: 101 %
FVC-%Change-Post: -2 %
FVC-%Pred-Post: 99 %
FVC-%Pred-Pre: 101 %
FVC-Post: 3.58 L
FVC-Pre: 3.67 L
Post FEV1/FVC ratio: 84 %
Post FEV6/FVC ratio: 100 %
Pre FEV1/FVC ratio: 83 %
Pre FEV6/FVC Ratio: 100 %
RV % pred: 105 %
RV: 1.55 L
TLC % pred: 114 %
TLC: 5.62 L

## 2018-12-31 LAB — CBC WITH DIFFERENTIAL/PLATELET
Basophils Absolute: 0 10*3/uL (ref 0.0–0.1)
Basophils Relative: 0.6 % (ref 0.0–3.0)
Eosinophils Absolute: 0.1 10*3/uL (ref 0.0–0.7)
Eosinophils Relative: 1.5 % (ref 0.0–5.0)
HCT: 38.7 % (ref 36.0–46.0)
Hemoglobin: 12.9 g/dL (ref 12.0–15.0)
Lymphocytes Relative: 24.9 % (ref 12.0–46.0)
Lymphs Abs: 1.9 10*3/uL (ref 0.7–4.0)
MCHC: 33.3 g/dL (ref 30.0–36.0)
MCV: 93.2 fl (ref 78.0–100.0)
Monocytes Absolute: 0.5 10*3/uL (ref 0.1–1.0)
Monocytes Relative: 6.7 % (ref 3.0–12.0)
Neutro Abs: 5 10*3/uL (ref 1.4–7.7)
Neutrophils Relative %: 66.3 % (ref 43.0–77.0)
Platelets: 255 10*3/uL (ref 150.0–400.0)
RBC: 4.15 Mil/uL (ref 3.87–5.11)
RDW: 13.5 % (ref 11.5–15.5)
WBC: 7.6 10*3/uL (ref 4.0–10.5)

## 2018-12-31 LAB — BASIC METABOLIC PANEL
BUN: 13 mg/dL (ref 6–23)
CO2: 24 mEq/L (ref 19–32)
Calcium: 8.7 mg/dL (ref 8.4–10.5)
Chloride: 105 mEq/L (ref 96–112)
Creatinine, Ser: 0.88 mg/dL (ref 0.40–1.20)
GFR: 71.99 mL/min (ref >60.00)
Glucose, Bld: 92 mg/dL (ref 70–99)
Potassium: 3.8 mEq/L (ref 3.5–5.1)
Sodium: 137 mEq/L (ref 135–145)

## 2018-12-31 LAB — BRAIN NATRIURETIC PEPTIDE: Pro B Natriuretic peptide (BNP): 55 pg/mL (ref 0.0–100.0)

## 2018-12-31 LAB — TSH: TSH: 0.43 u[IU]/mL (ref 0.35–4.50)

## 2018-12-31 LAB — D-DIMER, QUANTITATIVE: D-Dimer, Quant: 0.23 mcg/mL FEU (ref <0.50)

## 2018-12-31 MED ORDER — METHYLPREDNISOLONE ACETATE 80 MG/ML IJ SUSP
120.0000 mg | Freq: Once | INTRAMUSCULAR | Status: AC
  Administered 2018-12-31: 120 mg via INTRAMUSCULAR

## 2018-12-31 MED ORDER — ACETAMINOPHEN-CODEINE #3 300-30 MG PO TABS: 1.0000 | ORAL_TABLET | ORAL | 0 refills | Status: DC | PRN

## 2018-12-31 NOTE — Progress Notes (Signed)
Full PFT performed today. °

## 2018-12-31 NOTE — Patient Instructions (Addendum)
Stop dulera  Only use your levoalbuterol(xopenex)  as a rescue medication to be used if you can't catch your breath by resting or doing a relaxed purse lip breathing pattern.  - The less you use it, the better it will work when you need it. - Ok to use up to 2 puffs  every 4 hours if you must but call for immediate appointment if use goes up over your usual need - Don't leave home without it !!  (think of it like the spare tire for your car) The key to effective treatment for your cough is eliminating the non-stop cycle of cough you're stuck in long enough to let your airway heal completely and then see if there is anything still making you cough once you stop the cough suppression, but this should take no more than 5 days to figure out   Take delsym two tsp every 12 hours and supplement if needed with  Tylenol #3   up to 1-2 every 4 hours to suppress the urge to cough. Swallowing water and/or using ice chips/non mint and menthol containing candies (such as lifesavers or sugarless jolly ranchers) are also effective.  You should rest your voice and avoid activities that you know make you cough.  Once you have eliminated the cough for 3 straight days try reducing the Tylenol #3 first,  then the delsym as tolerated.       Protonix (pantoprazole) Take 30-60 min before first meal of the day and Pepcid 20 mg one bedtime plus chlorpheniramine 4 mg x 2 at bedtime (both available over the counter)  until cough is completely gone for at least a week without the need for cough suppression  GERD (REFLUX)  is an extremely common cause of respiratory symptoms, many times with no significant heartburn at all.    It can be treated with medication, but also with lifestyle changes including avoidance of late meals, excessive alcohol, smoking cessation, and avoid fatty foods, chocolate, peppermint, colas, red wine, and acidic juices such as orange juice.  NO MINT OR MENTHOL PRODUCTS SO NO COUGH DROPS   USE HARD  CANDY INSTEAD (jolley ranchers or Stover's or Lifesavers (all available in sugarless versions) NO OIL BASED VITAMINS - use powdered substitutes  Depomedrol 120 mg IM .  No work for two weeks then office visit with all meds in hand  Please remember to go to the lab and x-ray department   for your tests - we will call you with the results when they are available.

## 2018-12-31 NOTE — Progress Notes (Signed)
Kathy Martin, female    DOB: 1981-03-08,    MRN: [id number]   Brief patient profile:  30 yowf never smoker last baby 2013 with drippy nose/nasal  daily basis since and  150-160 baseline wt  and maintained this wt until around 2019 with rapid wt gain with progressive doe since and pattern of "recurrent bronchitis" dating back 2016 and bad episode of bronchitis end of Feb 2020 rx multiple ov's with abx / prednisone   Improves transiently  but flares again within days so started [facility] but never back to baseline so  referred 11/19/2018 by April Giles    History of Present Illness  11/19/2018  Pulmonary/ 1st office eval/Kathy Martin - last pred in march 2020  Chief Complaint  Patient presents with  . Pulmonary Consult    Referred April Giles NP Pt c/o SOB x 4 months. She states she tends to get bronchitis. Her cough is non prod.  Dyspnea:  Room to room  Cough: much better on prednisone as is the nasal drip/ could not tol singulair /worse with voice use  Sleep: min cough /on back flat one pillow /no am flares  SABA use: last saba > 18 h prior to OV  And no wixela 500 rec Pantoprazole (protonix) 40 mg   Take  30-60 min before first meal of the day and Pepcid (famotidine)  20 mg one hours before  bedtime   GERD  diet. Stop wixela and start dulera 100 Take 2 puffs first thing in am and then another 2 puffs about 12 hours later.  If not improving > Prednisone 10 mg take  4 each am x 2 days,   2 each am x 2 days,  1 each am x 2 days and stop    Televist 12/03/18 We will get you scheduled for CT chest without contrast >neg  We will also get you set up for a sinus CT>neg  Continue Dulera 100 Please start taking chlorpheniramine (aka Chlor tabs) 4 mg tablet (1 to 2 tablets at night)   12/31/2018  f/u ov/Kathy Martin re: refractory cough/ sob  Chief Complaint  Patient presents with  . Follow-up    Cough and SOB are not improving. She does not use her rescue inhaler bc she states it does not help.   Dyspnea:   Doe x 25 ft, worse if talk Cough: triggered by smells/ talking / dry hacking  Sleeping: fine one pillow on back p  2 x h1 SABA use: none since on dulera but dulera seems to make her cough worse Chewing lots of orbit sugarless gum      No obvious day to day or daytime variability or assoc excess/ purulent sputum or mucus plugs or hemoptysis or cp or chest tightness, subjective wheeze or overt sinus or hb symptoms.   Sleeping as above without nocturnal  or early am exacerbation  of respiratory  c/o's or need for noct saba. Also denies any obvious fluctuation of symptoms with weather or environmental changes or other aggravating or alleviating factors except as outlined above   No unusual exposure hx or h/o childhood pna/ asthma or knowledge of premature birth.  Current Allergies, Complete Past Medical History, Past Surgical History, Family History, and Social History were reviewed in [employer] record.  ROS  The following are not active complaints unless bolded Hoarseness, sore throat, dysphagia, dental problems, itching, sneezing,  nasal congestion or discharge of excess mucus or purulent secretions, ear ache,   fever, chills,  sweats, unintended wt loss or wt gain, classically pleuritic or exertional cp,  orthopnea pnd or arm/hand swelling  or leg swelling, presyncope, palpitations, abdominal pain, anorexia, nausea, vomiting, diarrhea  or change in bowel habits or change in bladder habits, change in stools or change in urine, dysuria, hematuria,  rash, arthralgias, visual complaints, headache, numbness, weakness or ataxia or problems with walking or coordination,  change in mood or  memory.        Current Meds  Medication Sig  . albuterol (VENTOLIN HFA) 108 (90 Base) MCG/ACT inhaler Inhale 2 puffs into the lungs every 4 (four) months.  . ALPRAZolam (XANAX) 0.25 MG tablet Take 0.25 mg by mouth daily as needed for anxiety.  . famotidine (PEPCID) 20 MG tablet One at  bedtime  . FLUoxetine (PROZAC) 20 MG capsule Take 20 mg by mouth daily.   Marland Kitchen. levalbuterol (XOPENEX) 0.63 MG/3ML nebulizer solution 1 vial in neb every 4 hours as needed  . levothyroxine (SYNTHROID, LEVOTHROID) 50 MCG tablet Take 50 mcg by mouth daily before breakfast.  . mometasone-formoterol (DULERA) 100-5 MCG/ACT AERO Take 2 puffs first thing in am and then another 2 puffs about 12 hours later.  . pantoprazole (PROTONIX) 40 MG tablet Take 1 tablet (40 mg total) by mouth daily. Take 30-60 min before first meal of the day  . Polyethyl Glycol-Propyl Glycol (SYSTANE ULTRA) 0.4-0.3 % SOLN Place 1 drop into both eyes daily.               .  Past Medical History:  Diagnosis Date  . Anxiety   . Anxiety   . Anxiety disorder   . Arthritis   . Hypothyroidism   . Hypothyroidism   . Palpitations   . Palpitations   . PONV (postoperative nausea and vomiting)   . Thyroid disease        Objective:     Wt Readings from Last 3 Encounters:  12/31/18 206 lb 12.8 oz (93.8 kg)  11/19/18 196 lb 6.4 oz (89.1 kg)  07/25/18 180 lb (81.6 kg)     Vital signs reviewed - Note on arrival 02 sats  97% on RA     amb obese wf nad but freq throat clearing   HEENT: nl dentition, turbinates bilaterally, and oropharynx. Nl external ear canals without cough reflex   NECK :  without JVD/Nodes/TM/ nl carotid upstrokes bilaterally   LUNGS: no acc muscle use,  Nl contour chest which is clear to A and P bilaterally without cough on insp or exp maneuvers   CV:  RRR  no s3 or murmur or increase in P2, and no edema   ABD:  soft and nontender with nl inspiratory excursion in the supine position. No bruits or organomegaly appreciated, bowel sounds nl  MS:  Nl gait/ ext warm without deformities, calf tenderness, cyanosis or clubbing No obvious joint restrictions   SKIN: warm and dry without lesions    NEURO:  alert, approp, nl sensorium with  no motor or cerebellar deficits apparent.       I  personally reviewed images and agree with radiology impression as follows:   1) Chest CT 12/20/18 Unremarkable noncontrast lung CT. No pulmonary abnormalities Identified.  2 ) Sinus CT 12/20/18 wnl    Labs ordered/ reviewed:      Chemistry      Component Value Date/Time   NA 137 12/31/2018 1253   K 3.8 12/31/2018 1253   CL 105 12/31/2018 1253   CO2 24 12/31/2018 1253  BUN 13 12/31/2018 1253   CREATININE 0.88 12/31/2018 1253      Component Value Date/Time   CALCIUM 8.7 12/31/2018 1253   ALKPHOS 58 06/03/2018 1325   AST 17 06/03/2018 1325   ALT 17 06/03/2018 1325   BILITOT 0.7 06/03/2018 1325        Lab Results  Component Value Date   WBC 7.6 12/31/2018   HGB 12.9 12/31/2018   HCT 38.7 12/31/2018   MCV 93.2 12/31/2018   PLT 255.0 12/31/2018     Lab Results  Component Value Date   DDIMER 0.23 12/31/2018      Lab Results  Component Value Date   TSH 0.43 12/31/2018     Lab Results  Component Value Date   PROBNP 55.0 12/31/2018                 Assessment

## 2019-01-01 ENCOUNTER — Encounter: Payer: Self-pay | Admitting: Internal Medicine

## 2019-01-01 NOTE — Assessment & Plan Note (Signed)
Onset  2016 but much worse  since end Feb 2020 on wixella 500 - singulair intol - 11/19/2018  After extensive coaching inhaler device,  effectiveness =    90% > try dulera 100 2 bid and stop wixella  - Allergy profile 11/19/2018 >  Eos 191 /  IgE  23 RAST pos dog dander - Sinus CT  12/20/18 neg - CT chest 12/20/18 neg  - 5/63/8756 rec cyclical cough regimen and stop all mints and d/c dulera   Of the three most common causes of  Sub-acute / recurrent or chronic cough, only one (GERD)  can actually contribute to/ trigger  the other two (asthma and post nasal drip syndrome)  and perpetuate the cylce of cough.  While not intuitively obvious, many patients with chronic low grade reflux do not cough until there is a primary insult that disturbs the protective epithelial barrier and exposes sensitive nerve endings.   This is typically viral but can due to PNDS and  either may apply here.    >>>  The point is that once this occurs, it is difficult to eliminate the cycle  using anything but a maximally effective acid suppression regimen at least in the short run, accompanied by an appropriate diet to address non acid GERD and control PNDS with 1st gen H1 blockers per guidelines  And eliminate the cough itself for at least 3 days with tyl #3 then return in 2 weeks with all meds in hand using a trust but verify approach to confirm accurate Medication  Reconciliation The principal here is that until we are certain that the  patients are doing what we've asked, it makes no sense to ask them to do more.   No work x 2 weeks Stop dulera as no evidence to support asthma here

## 2019-01-01 NOTE — Progress Notes (Signed)
Spoke with pt and notified of results per Dr. Wert. Pt verbalized understanding and denied any questions. 

## 2019-01-01 NOTE — Assessment & Plan Note (Signed)
Onset with cough flare 2019 / wt gain  - pfts wnl 12/31/2018  -  12/31/2018   Walked RA  2 laps @ approx 231ft each @ brisk  pace  stopped due to end of study, no desats, no sob but coughed the whole time  No evidence of pe, ild, anemia, chf or thyroid dz  Again feel the cough is the main problem here (see separate a/p)    I had an extended discussion with the patient   reviewing all relevant studies completed to date and  lasting 15 to 20 minutes of a 25 minute visit  which included directly observing ambulatory 02 saturation study documented in a/p section of  today's  office note.  Each maintenance medication was reviewed in detail including most importantly the difference between maintenance and prns and under what circumstances the prns are to be triggered using an action plan format that is not reflected in the computer generated alphabetically organized AVS.     Please see AVS for specific instructions unique to this visit that I personally wrote and verbalized to the the pt in detail and then reviewed with pt  by my nurse highlighting any changes in therapy recommended at today's visit .

## 2019-01-02 NOTE — Telephone Encounter (Signed)
Ok for work note x 2 weeks I don't see a cough med that needs PA - need specific name  

## 2019-01-02 NOTE — Telephone Encounter (Signed)
[place] for work note x 2 weeks I don't see a cough med that needs PA - need specific name

## 2019-01-02 NOTE — Telephone Encounter (Signed)
Called patient by phone to discuss work note.  Patient states she 'cannot work wearing a mask' which is mandatory on her job and Dr. Melvyn Novas had agreed to take her out of work until she returns to our office for an appointment July 20.  Patient works for the [employer]  She states the [employer] agreed for her to work from home if she had a note from MD stating she is unable to wear a mask at work.  She does not need a work absence excuse just a note to state she cannot meet the mask requirement and will be re-evaluated at her next visit July 20 with Dr. Melvyn Novas.  Routing to Dr. Melvyn Novas for review.  Dr. Melvyn Novas please advise if a note can be provided for this patient.

## 2019-01-12 NOTE — Progress Notes (Signed)
This was discussed on 12/31/2018 office visit with Dr. Melvyn Novas.  Nothing further is needed.  Wyn Quaker FNP

## 2019-01-14 ENCOUNTER — Ambulatory Visit: Payer: BC Managed Care – PPO | Admitting: Internal Medicine

## 2019-01-20 ENCOUNTER — Encounter: Payer: Self-pay | Admitting: *Deleted

## 2019-01-20 ENCOUNTER — Ambulatory Visit: Payer: BC Managed Care – PPO

## 2019-01-20 ENCOUNTER — Encounter: Payer: Self-pay | Admitting: Internal Medicine

## 2019-01-20 ENCOUNTER — Ambulatory Visit: Payer: BC Managed Care – PPO | Admitting: Internal Medicine

## 2019-01-20 ENCOUNTER — Other Ambulatory Visit: Payer: Self-pay

## 2019-01-20 DIAGNOSIS — J45991 Cough variant asthma: Secondary | ICD-10-CM | POA: Diagnosis not present

## 2019-01-20 DIAGNOSIS — R058 Other specified cough: Secondary | ICD-10-CM

## 2019-01-20 DIAGNOSIS — R05 Cough: Secondary | ICD-10-CM | POA: Diagnosis not present

## 2019-01-20 MED ORDER — GABAPENTIN 100 MG PO CAPS: 100.0000 mg | ORAL_CAPSULE | Freq: Three times a day (TID) | ORAL | 2 refills | Status: DC

## 2019-01-20 MED ORDER — METHYLPREDNISOLONE ACETATE 80 MG/ML IJ SUSP
120.0000 mg | Freq: Once | INTRAMUSCULAR | Status: AC
  Administered 2019-01-20: 16:00:00 120 mg via INTRAMUSCULAR

## 2019-01-20 NOTE — Progress Notes (Signed)
Kathy KehrBrandi L Garrison, female    DOB: [birth year],    MRN: [id number]   Brief patient profile:  37 yowf never smoker last baby 2013 with drippy nose/nasal  daily basis since and  150-160 baseline wt  and maintained this wt until around 2019 with rapid wt gain with progressive doe since and pattern of "recurrent bronchitis" dating back 2016 and bad episode of bronchitis end of Feb 2020 rx multiple ov's with abx / prednisone   Improves transiently  but flares again within days [facility] so  referred 11/19/2018 by Kathy Martin    History of Present Illness  11/19/2018  Pulmonary/ 1st office eval/Kathy Martin - last pred in march 2020  Chief Complaint  Patient presents with  . Pulmonary Consult    Referred Kathy Giles NP Pt c/o SOB x 4 months. She states she tends to get bronchitis. Her cough is non prod.  Dyspnea:  Room to room  Cough: much better on prednisone as is the nasal drip/ could not tol singulair /worse with voice use  Sleep: min cough /on back flat one pillow /no am flares  SABA use: last saba > 18 h prior to OV  And no wixela 500 rec Pantoprazole (protonix) 40 mg   Take  30-60 min before first meal of the day and Pepcid (famotidine)  20 mg one hours before  bedtime   GERD  diet. Stop wixela and start dulera 100 Take 2 puffs first thing in am and then another 2 puffs about 12 hours later.  If not improving > Prednisone 10 mg take  4 each am x 2 days,   2 each am x 2 days,  1 each am x 2 days and stop    Televist 12/03/18 We will get you scheduled for CT chest without contrast >neg  We will also get you set up for a sinus CT>neg  Continue Dulera 100 Please start taking chlorpheniramine (aka Chlor tabs) 4 mg tablet (1 to 2 tablets at night)   12/31/2018  f/u ov/Kathy Martin re: refractory cough/ sob  Chief Complaint  Patient presents with  . Follow-up    Cough and SOB are not improving. She does not use her rescue inhaler bc she states it does not help.   Dyspnea:   Doe x 25 ft, worse if talk Cough: triggered by smells/ talking / dry hacking  Sleeping: fine one pillow on back p  2 x h1 SABA use: none since on dulera but dulera seems to make her cough worse Chewing lots of orbit sugarless gum  rec Stop dulera Only use your levoalbuterol(xopenex)  as a rescue medication Take delsym two tsp every 12 hours and supplement if needed with  Tylenol #3   up to 1-2 every 4 hours  Protonix (pantoprazole) Take 30-60 min before first meal of the day and Pepcid 20 mg one bedtime plus chlorpheniramine 4 mg x 2 at bedtime (both available over the counter)  until cough is completely gone for at least a week without the need for cough suppression GERD (REFLUX)  is an extremely common cause of respiratory symptoms Depomedrol 120 mg IM   No work for two weeks then office visit with all meds in hand    01/20/2019  f/u ov/Kathy Martin re: cough x Feb 2020/ did not take Tyl #3 , did not understand the idea was to stop all throat clearing, still using trident gum smells of mint  Chief Complaint  Patient  presents with  . Follow-up    Cough has improved some. Still having some PND and throat clearing.   Dyspnea:  Still can't talk, but activity tol  = 30 min on bike on low resistance  Cough: dry hack daytime  Sleeping: one pillow p chlorpheniramine x 2  SABA use: none  02: none   No obvious day to day or daytime variability or assoc excess/ purulent sputum or mucus plugs or hemoptysis or cp or chest tightness, subjective wheeze or overt sinus or hb symptoms.   non without nocturnal  or early am exacerbation  of respiratory  c/o's or need for noct saba. Also denies any obvious fluctuation of symptoms with weather or environmental changes or other aggravating or alleviating factors except as outlined above   No unusual exposure hx or h/o childhood pna/ asthma or knowledge of premature birth.  Current Allergies, Complete Past Medical History, Past Surgical History, Family History,  and Social History were reviewed in [employer] record.  ROS  The following are not active complaints unless bolded Hoarseness, sore throat, dysphagia, dental problems, itching, sneezing,  nasal congestion or discharge of excess mucus or purulent secretions, ear ache,   fever, chills, sweats, unintended wt loss or wt gain, classically pleuritic or exertional cp,  orthopnea pnd or arm/hand swelling  or leg swelling, presyncope, palpitations, abdominal pain, anorexia, nausea, vomiting, diarrhea  or change in bowel habits or change in bladder habits, change in stools or change in urine, dysuria, hematuria,  rash, arthralgias, visual complaints, headache, numbness, weakness or ataxia or problems with walking or coordination,  change in mood or  memory.        Current Meds  Medication Sig  . acetaminophen-codeine (TYLENOL #3) 300-30 MG tablet Take 1-2 tablets by mouth every 4 (four) hours as needed (cough).  Marland Kitchen albuterol (VENTOLIN HFA) 108 (90 Base) MCG/ACT inhaler Inhale 2 puffs into the lungs every 4 (four) months.  . ALPRAZolam (XANAX) 0.25 MG tablet Take 0.25 mg by mouth daily as needed for anxiety.  . famotidine (PEPCID) 20 MG tablet One at bedtime  . FLUoxetine (PROZAC) 20 MG capsule Take 20 mg by mouth daily.   Marland Kitchen levalbuterol (XOPENEX) 0.63 MG/3ML nebulizer solution 1 vial in neb every 4 hours as needed  . levothyroxine (SYNTHROID, LEVOTHROID) 50 MCG tablet Take 50 mcg by mouth daily before breakfast.  . pantoprazole (PROTONIX) 40 MG tablet Take 1 tablet (40 mg total) by mouth daily. Take 30-60 min before first meal of the day  . Polyethyl Glycol-Propyl Glycol (SYSTANE ULTRA) 0.4-0.3 % SOLN Place 1 drop into both eyes daily.        .   Past Medical History:  Diagnosis Date  . Anxiety   . Anxiety   . Anxiety disorder   . Arthritis   . Hypothyroidism   . Hypothyroidism   . Palpitations   . Palpitations   . PONV (postoperative nausea and vomiting)   . Thyroid  disease        Objective:     01/20/2019       200  12/31/18 206 lb 12.8 oz (93.8 kg)  11/19/18 196 lb 6.4 oz (89.1 kg)  07/25/18 180 lb (81.6 kg)     Vital signs reviewed - Note on arrival 02 sats  98% on RA      amb mod obese wf nad but freq throat clearing    HEENT: nl dentition, turbinates bilaterally, and oropharynx. Nl external ear canals without  cough reflex   NECK :  without JVD/Nodes/TM/ nl carotid upstrokes bilaterally   LUNGS: no acc muscle use,  Nl contour chest which is clear to A and P bilaterally without cough on insp or exp maneuvers   CV:  RRR  no s3 or murmur or increase in P2, and no edema   ABD:  soft and nontender with nl inspiratory excursion in the supine position. No bruits or organomegaly appreciated, bowel sounds nl  MS:  Nl gait/ ext warm without deformities, calf tenderness, cyanosis or clubbing No obvious joint restrictions   SKIN: warm and dry without lesions    NEURO:  alert, approp, nl sensorium with  no motor or cerebellar deficits apparent.          Assessment

## 2019-01-20 NOTE — Patient Instructions (Addendum)
Depomedrol 120 mg IM   Take delsym two tsp every 12 hours and supplement if needed with  Tylenol #3   up to 1  every 4 hours to suppress even  the urge to cough. Swallowing water and/or using ice chips/non mint and menthol containing candies (such as lifesavers or sugarless jolly ranchers) are also effective.  You should rest your voice and avoid activities that you know make you cough.  Once you have eliminated the cough for 3 straight days try reducing the Tylenol #3 first,  then the delsym as tolerated.      If not effective the next is to add gabapentin 100 mg three times a day and if not better at that we will send to see Dr Carol Ada at [facility]   Work from home until you can stop all coughing and throat clearing  For  A week.   Please schedule a follow up office visit in 6 weeks, call sooner if needed

## 2019-01-21 ENCOUNTER — Institutional Professional Consult (permissible substitution): Payer: BLUE CROSS/BLUE SHIELD | Admitting: Internal Medicine

## 2019-01-21 ENCOUNTER — Encounter: Payer: Self-pay | Admitting: Internal Medicine

## 2019-01-21 NOTE — Assessment & Plan Note (Signed)
Onset  2016 but much worse  since end Feb 2020 on wixella 500 - singulair intol - 11/19/2018  After extensive coaching inhaler device,  effectiveness =    90% > try dulera 100 2 bid and stop wixella  - Allergy profile 11/19/2018 >  Eos 191 /  IgE  23 RAST pos dog dander - Sinus CT  12/20/18 neg - CT chest 12/20/18 neg  - 0/97/3532 rec cyclical cough regimen and stop all mints  and d/c dulera - 01/20/2019 breathing fine but still daytime throat clearing > rec [place] candy, cyclical cough rx then gabapentin 100 tid if not better   Of the three most common causes of  Sub-acute / recurrent or chronic cough, only one (GERD)  can actually contribute to/ trigger  the other two (asthma and post nasal drip syndrome)  and perpetuate the cylce of cough.  While not intuitively obvious, many patients with chronic low grade reflux do not cough until there is a primary insult that disturbs the protective epithelial barrier and exposes sensitive nerve endings.   This is typically viral but can due to PNDS and  either may apply here.   The point is that once this occurs, it is difficult to eliminate the cycle  using anything but a maximally effective acid suppression regimen at least in the short run, accompanied by an appropriate diet to address non acid GERD and control / eliminate the cough itself for at least 3 days with tyl #3 then gabapentin next then [facility] referral if not improving.    I had an extended discussion with the patient reviewing all relevant studies completed to date and  lasting 15 to 20 minutes of a 25 minute visit    Each maintenance medication was reviewed in detail including most importantly the difference between maintenance and prns and under what circumstances the prns are to be triggered using an action plan format that is not reflected in the computer generated alphabetically organized AVS.     Please see AVS for specific instructions unique to this visit that I personally wrote and  verbalized to the the pt in detail and then reviewed with pt  by my nurse highlighting any  changes in therapy recommended at today's visit to their plan of care.

## 2019-01-26 DIAGNOSIS — J45991 Cough variant asthma: Secondary | ICD-10-CM

## 2019-01-27 MED ORDER — PANTOPRAZOLE SODIUM 40 MG PO TBEC: 40.0000 mg | DELAYED_RELEASE_TABLET | Freq: Every day | ORAL | 5 refills | Status: DC

## 2019-03-03 ENCOUNTER — Encounter: Payer: Self-pay | Admitting: *Deleted

## 2019-03-03 ENCOUNTER — Other Ambulatory Visit: Payer: Self-pay

## 2019-03-03 ENCOUNTER — Ambulatory Visit: Payer: BC Managed Care – PPO | Admitting: Internal Medicine

## 2019-03-03 ENCOUNTER — Encounter: Payer: Self-pay | Admitting: Internal Medicine

## 2019-03-03 VITALS — BP 118/80 | HR 91 | Temp 98.0°F | Ht 63.0 in | Wt 196.0 lb

## 2019-03-03 DIAGNOSIS — J45991 Cough variant asthma: Secondary | ICD-10-CM

## 2019-03-03 MED ORDER — GABAPENTIN 300 MG PO CAPS: 300.0000 mg | ORAL_CAPSULE | Freq: Three times a day (TID) | ORAL | 2 refills | Status: DC

## 2019-03-03 MED ORDER — METHYLPREDNISOLONE ACETATE 80 MG/ML IJ SUSP
120.0000 mg | Freq: Once | INTRAMUSCULAR | Status: AC
  Administered 2019-03-03: 11:00:00 120 mg via INTRAMUSCULAR

## 2019-03-03 NOTE — Progress Notes (Signed)
Kathy Martin, female    DOB: [birth year],    MRN: [id number]   Brief patient profile:  37 yowf never smoker last baby 2013 with drippy nose/nasal  daily basis since and  150-160 baseline wt  and maintained this wt until around 2019 with rapid wt gain with progressive doe since and pattern of "recurrent bronchitis" dating back 2016 and bad episode of bronchitis end of Feb 2020 rx multiple ov's with abx / prednisone   Improves transiently  but flares again within days [facility] but never back to baseline so  referred 11/19/2018 by April Giles with w/u  RAST pos only for dog dander     History of Present Illness  11/19/2018  Pulmonary/ 1st office eval/[facility] [place] - last pred in march 2020  Chief Complaint  Patient presents with   Pulmonary Consult    Referred April Giles NP Pt c/o SOB x 4 months. She states she tends to get bronchitis. Her cough is non prod.  Dyspnea:  Room to room  Cough: much better on prednisone as is the nasal drip/ could not tol singulair /worse with voice use  Sleep: min cough /on back flat one pillow /no am flares  SABA use: last saba > 18 h prior to OV  And no wixela 500 rec Pantoprazole (protonix) 40 mg   Take  30-60 min before first meal of the day and Pepcid (famotidine)  20 mg one hours before  bedtime   GERD  diet. Stop wixela and start dulera 100 Take 2 puffs first thing in am and then another 2 puffs about 12 hours later.  If not improving > Prednisone 10 mg take  4 each am x 2 days,   2 each am x 2 days,  1 each am x 2 days and stop    Televist 12/03/18 We will get you scheduled for CT chest without contrast >neg  We will also get you set up for a sinus CT>neg  Continue Dulera 100 Please start taking chlorpheniramine (aka Chlor tabs) 4 mg tablet (1 to 2 tablets at night)   12/31/2018  f/u ov/Kellon Chalk re: refractory cough/ sob  Chief Complaint  Patient presents with   Follow-up    Cough and SOB are not improving. She does not use her rescue inhaler bc  she states it does not help.   Dyspnea:  Doe x 25 ft, worse if talk Cough: triggered by smells/ talking / dry hacking  Sleeping: fine one pillow on back p  2 x h1 SABA use: none since on dulera but dulera seems to make her cough worse Chewing lots of orbit sugarless gum  rec Stop dulera Only use your levoalbuterol(xopenex)  as a rescue medication Take delsym two tsp every 12 hours and supplement if needed with  Tylenol #3   up to 1-2 every 4 hours  Protonix (pantoprazole) Take 30-60 min before first meal of the day and Pepcid 20 mg one bedtime plus chlorpheniramine 4 mg x 2 at bedtime (both available over the counter)  until cough is completely gone for at least a week without the need for cough suppression GERD (REFLUX)  is an extremely common cause of respiratory symptoms Depomedrol 120 mg IM   No work for two weeks then office visit with all meds in hand    01/20/2019  f/u ov/Kohlton Gilpatrick re: cough x Feb 2020/ did not take Tyl #3 , did not understand the idea was to stop all throat clearing, still using  trident gum smells of mint  Chief Complaint  Patient presents with   Follow-up    Cough has improved some. Still having some PND and throat clearing.   Dyspnea:  Still can't talk, but activity tol  = 30 min on bike on low resistance  Cough: dry hack daytime  Sleeping: one pillow p chlorpheniramine x 2  rec Depomedrol 120 mg IM  Take delsym two tsp every 12 hours and supplement if needed with  Tylenol #3   up to 1  every 4 hours to suppress even  the urge to cough.  If not effective the next is to add gabapentin 100 mg three times a day and if not better at that we will send to see Dr Carol Ada at [facility]  Work from home until you can stop all coughing and throat clearing  For  A week.     03/03/2019  Acute  ov/Korrey Schleicher re: 100% better until early august while on gerd treatment, started feeling globus sensation and started gabapentin due to urge to clear throat and felt a lot better until  02/27/19 then woke up throat burning, HA same pattern recurrence x  2016  Chief Complaint  Patient presents with   Acute Visit    Cough was doing well until it got worse about 5 days ago. She has had some chest tightness and "burning" in her chest "feels like there is something hanging in my throat".  Dyspnea:  Able to ex until onset  Of severe cough 5 d prior to OV   Cough: hoarse/ urge to clear throat nothing comes up/ aggravated by laughing and using voice Sleeping: at 45 degrees  SABA use: none 02: none    No obvious day to day or daytime variability or assoc excess/ purulent sputum or mucus plugs or hemoptysis or cp or chest tightness, subjective wheeze or overt sinus or hb symptoms.     Also denies any obvious fluctuation of symptoms with weather or environmental changes or other aggravating or alleviating factors except as outlined above   No unusual exposure hx or h/o childhood pna/ asthma or knowledge of premature birth.  Current Allergies, Complete Past Medical History, Past Surgical History, Family History, and Social History were reviewed in [employer] record.  ROS  The following are not active complaints unless bolded Hoarseness, sore throat, dysphagia, dental problems, itching, sneezing,  nasal congestion or discharge of excess mucus or purulent secretions, ear ache,   fever, chills, sweats, unintended wt loss or wt gain, classically pleuritic or exertional cp,  orthopnea pnd or arm/hand swelling  or leg swelling, presyncope, palpitations, abdominal pain, anorexia, nausea, vomiting, diarrhea  or change in bowel habits or change in bladder habits, change in stools or change in urine, dysuria, hematuria,  rash, arthralgias, visual complaints, headache, numbness, weakness or ataxia or problems with walking or coordination,  change in mood or  memory.        Current Meds  Medication Sig   acetaminophen-codeine (TYLENOL #3) 300-30 MG tablet Take 1-2 tablets  by mouth every 4 (four) hours as needed (cough).   ALPRAZolam (XANAX) 0.25 MG tablet Take 0.25 mg by mouth daily as needed for anxiety.   famotidine (PEPCID) 20 MG tablet One at bedtime   FLUoxetine (PROZAC) 20 MG capsule Take 20 mg by mouth daily.    gabapentin (NEURONTIN) 100 MG capsule Take 1 capsule (100 mg total) by mouth 3 (three) times daily. One three times daily   levothyroxine (  SYNTHROID, LEVOTHROID) 50 MCG tablet Take 50 mcg by mouth daily before breakfast.   pantoprazole (PROTONIX) 40 MG tablet Take 1 tablet (40 mg total) by mouth daily. Take 30-60 min before first meal of the day           .   Past Medical History:  Diagnosis Date   Anxiety    Anxiety    Anxiety disorder    Arthritis    Hypothyroidism    Hypothyroidism    Palpitations    Palpitations    PONV (postoperative nausea and vomiting)    Thyroid disease        Objective:     03/03/2019        196 01/20/2019       200  12/31/18 206 lb 12.8 oz (93.8 kg)  11/19/18 196 lb 6.4 oz (89.1 kg)  07/25/18 180 lb (81.6 kg)     Hoarse amb wf with freq throat clearing  HEENT : pt wearing mask not removed for exam due to covid - 19 concerns.   NECK :  without JVD/Nodes/TM/ nl carotid upstrokes bilaterally   LUNGS: no acc muscle use,  Nl contour chest which is clear to A and P bilaterally without cough on insp or exp maneuvers   CV:  RRR  no s3 or murmur or increase in P2, and no edema   ABD:  soft and nontender with nl inspiratory excursion in the supine position. No bruits or organomegaly appreciated, bowel sounds nl  MS:  Nl gait/ ext warm without deformities, calf tenderness, cyanosis or clubbing No obvious joint restrictions   SKIN: warm and dry without lesions    NEURO:  alert, approp, nl sensorium with  no motor or cerebellar deficits apparent.          Assessment

## 2019-03-03 NOTE — Patient Instructions (Signed)
Depomedrol 120 mg IM   Take delsym two tsp every 12 hours and supplement if needed with  Tylenol #3   up to 1  every 4 hours to suppress even  the urge to cough. Swallowing water and/or using ice chips/non mint and menthol containing candies (such as lifesavers or sugarless jolly ranchers) are also effective.  You should rest your voice and avoid activities that you know make you cough.  Once you have eliminated the cough for 3 straight days try reducing the Tylenol #3 first,  then the delsym as tolerated.      Gabapentin 300 mg three times daily   Will need to be out of work x 2 weeks due to cough   We will be referring you to Dr Joya Gaskins   Please schedule a follow up office visit in 6 weeks, call sooner if needed with all medications /inhalers/ solutions in hand so we can verify exactly what you are taking. This includes all medications from all doctors and over the counters

## 2019-03-04 ENCOUNTER — Encounter: Payer: Self-pay | Admitting: Internal Medicine

## 2019-03-04 ENCOUNTER — Telehealth: Payer: Self-pay | Admitting: *Deleted

## 2019-03-04 NOTE — Assessment & Plan Note (Addendum)
Onset  2016 but much worse  since end Feb 2020 on wixella 500 - singulair intol - 11/19/2018  After extensive coaching inhaler device,  effectiveness =    90% > try dulera 100 2 bid and stop wixella  - Allergy profile 11/19/2018 >  Eos 191 /  IgE  23 RAST pos dog dander - Sinus CT  12/20/18 neg - CT chest 12/20/18 neg  - 4/58/0998 rec cyclical cough regimen and stop all mints  and d/c dulera - 01/20/2019 breathing fine but still daytime throat clearing > rec hard rock candy, cyclical cough rx then gabapentin 100 tid if not better > started gabapentin around Aug 1 and much better until "bad st/severe globus sensation" end of Aug 2020 - 3/38/2505 rec cyclical cough rx and titrate gabapentin up to 300 tid/ ent eval Joya Gaskins    Of the three most common causes of  Sub-acute / recurrent or chronic cough, only one (GERD)  can actually contribute to/ trigger  the other two (asthma and post nasal drip syndrome)  and perpetuate the cylce of cough.  While not intuitively obvious, many patients with chronic low grade reflux do not cough until there is a primary insult that disturbs the protective epithelial barrier and exposes sensitive nerve endings.   This is typically viral but can due to PNDS and  either may apply here.     >>> The point is that once this occurs, it is difficult to eliminate the cycle  using anything but a maximally effective acid suppression regimen at least in the short run, accompanied by an appropriate diet to address non acid GERD and control / eliminate the cough itself for at least 3 days with tyl #3 and eliminate pnds with 1st gen H1 blockers per guidelines  Plus depomedrol 120 mg IM x one  ? If has tried singulair for pnds and note allergy to dogs which she needs to avoid.   I had an extended discussion with the patient reviewing all relevant studies completed to date and  lasting 15 to 20 minutes of a 25 minute acute visit  Addressing both chronic cough and acute exac   Each  maintenance medication was reviewed in detail including most importantly the difference between maintenance and prns and under what circumstances the prns are to be triggered using an action plan format that is not reflected in the computer generated alphabetically organized AVS.     Please see AVS for specific instructions unique to this visit that I personally wrote and verbalized to the the pt in detail and then reviewed with pt  by my nurse highlighting any  changes in therapy recommended at today's visit to their plan of care.

## 2019-03-04 NOTE — Telephone Encounter (Signed)
Spoke with the pt  She does have a dog at home- golden-doodle  She states she had discussed this with Dr Melvyn Novas on her second visit here and is aware to keep the dog out of the bedroom  She is on her way now to see Dr Joya Gaskins at the [facility]  Will forward to MW as Juluis Rainier

## 2019-03-04 NOTE — Telephone Encounter (Signed)
-----   Message from Tanda Rockers, MD sent at 03/04/2019  8:13 AM EDT ----- Forgot to discuss allergy profile ? Does she have dog or exp to dogs?

## 2019-03-05 ENCOUNTER — Encounter: Payer: Self-pay | Admitting: *Deleted

## 2019-03-07 ENCOUNTER — Encounter: Payer: Self-pay | Admitting: General Surgery

## 2019-03-07 NOTE — Telephone Encounter (Signed)
Ok to write the letter the way she's requesting

## 2019-03-17 ENCOUNTER — Telehealth: Payer: Self-pay | Admitting: Internal Medicine

## 2019-03-17 ENCOUNTER — Other Ambulatory Visit: Payer: Self-pay

## 2019-03-17 ENCOUNTER — Ambulatory Visit (INDEPENDENT_AMBULATORY_CARE_PROVIDER_SITE_OTHER): Payer: BC Managed Care – PPO | Admitting: Internal Medicine

## 2019-03-17 DIAGNOSIS — R0609 Other forms of dyspnea: Secondary | ICD-10-CM | POA: Diagnosis not present

## 2019-03-17 DIAGNOSIS — J45991 Cough variant asthma: Secondary | ICD-10-CM | POA: Diagnosis not present

## 2019-03-17 MED ORDER — PREDNISONE 10 MG PO TABS: ORAL_TABLET | ORAL | 0 refills | Status: DC

## 2019-03-17 MED ORDER — BENZONATATE 200 MG PO CAPS: 200.0000 mg | ORAL_CAPSULE | Freq: Three times a day (TID) | ORAL | 1 refills | Status: DC | PRN

## 2019-03-17 MED ORDER — MONTELUKAST SODIUM 10 MG PO TABS: 10.0000 mg | ORAL_TABLET | Freq: Every day | ORAL | 11 refills | Status: DC

## 2019-03-17 NOTE — Assessment & Plan Note (Signed)
Onset with cough flare 2019 / wt gain  - pfts wnl 12/31/2018  -  12/31/2018   Walked RA  2 laps @ approx 246ft each @ brisk  pace  stopped due to end of study, no desats, no sob but coughed the whole time   Strongly suspect doe is part of the spectrum of uacs/vcd and not unrelated separate issue but will check back in 2 weeks with walking sats again if still symptomatic to sort out.

## 2019-03-17 NOTE — Progress Notes (Signed)
Kathy Martin, female    DOB: 08-22-1980,    MRN: [id number]   Brief patient profile:  37 yowf never smoker last baby 2013 with drippy nose/nasal  daily basis since and  150-160 baseline wt  and maintained this wt until around 2019 with rapid wt gain with progressive doe since and pattern of "recurrent bronchitis" dating back 2016 and bad episode of bronchitis end of Feb 2020 rx multiple ov's with abx / prednisone   Improves transiently  but flares again within days so started [facility] but never back to baseline so  referred 11/19/2018 by April Giles with w/u  RAST pos only for dog dander     History of Present Illness  11/19/2018  Pulmonary/ 1st office eval/Kathy Martin - last pred in march 2020  Chief Complaint  Patient presents with   Pulmonary Consult    Referred April Giles NP Pt c/o SOB x 4 months. She states she tends to get bronchitis. Her cough is non prod.  Dyspnea:  Room to room  Cough: much better on prednisone as is the nasal drip/ could not tol singulair /worse with voice use  Sleep: min cough /on back flat one pillow /no am flares  SABA use: last saba > 18 h prior to OV  And no wixela 500 rec Pantoprazole (protonix) 40 mg   Take  30-60 min before first meal of the day and Pepcid (famotidine)  20 mg one hours before  bedtime   GERD  diet. Stop wixela and start dulera 100 Take 2 puffs first thing in am and then another 2 puffs about 12 hours later.  If not improving > Prednisone 10 mg take  4 each am x 2 days,   2 each am x 2 days,  1 each am x 2 days and stop    Televist 12/03/18 We will get you scheduled for CT chest without contrast >neg  We will also get you set up for a sinus CT>neg  Continue Dulera 100 Please start taking chlorpheniramine (aka Chlor tabs) 4 mg tablet (1 to 2 tablets at night)   12/31/2018  f/u ov/Kathy Martin re: refractory cough/ sob  Chief Complaint  Patient presents with   Follow-up    Cough and SOB are not improving. She does not use her rescue inhaler bc  she states it does not help.   Dyspnea:  Doe x 25 ft, worse if talk Cough: triggered by smells/ talking / dry hacking  Sleeping: fine one pillow on back p  2 x h1 SABA use: none since on dulera but dulera seems to make her cough worse Chewing lots of orbit sugarless gum  rec Stop dulera Only use your levoalbuterol(xopenex)  as a rescue medication Take delsym two tsp every 12 hours and supplement if needed with  Tylenol #3   up to 1-2 every 4 hours  Protonix (pantoprazole) Take 30-60 min before first meal of the day and Pepcid 20 mg one bedtime plus chlorpheniramine 4 mg x 2 at bedtime (both available over the counter)  until cough is completely gone for at least a week without the need for cough suppression GERD (REFLUX)  is an extremely common cause of respiratory symptoms Depomedrol 120 mg IM   No work for two weeks then office visit with all meds in hand    01/20/2019  f/u ov/Kathy Martin re: cough x Feb 2020/ did not take Tyl #3 , did not understand the idea was to stop all throat clearing, still using  trident gum smells of mint  Chief Complaint  Patient presents with   Follow-up    Cough has improved some. Still having some PND and throat clearing.   Dyspnea:  Still can't talk, but activity tol  = 30 min on bike on low resistance  Cough: dry hack daytime  Sleeping: one pillow p chlorpheniramine x 2  rec Depomedrol 120 mg IM  Take delsym two tsp every 12 hours and supplement if needed with  Tylenol #3   up to 1  every 4 hours to suppress even  the urge to cough.  If not effective the next is to add gabapentin 100 mg three times a day and if not better at that we will send to see Dr Carol Ada at [facility]  Work from home until you can stop all coughing and throat clearing  For  A week.     03/03/2019  Acute  ov/Kathy Martin re: 100% better until early august while on gerd treatment, started feeling globus sensation and started gabapentin due to urge to clear throat and felt a lot better until  02/27/19 then woke up throat burning, HA same pattern recurrence x  2016  Chief Complaint  Patient presents with   Acute Visit    Cough was doing well until it got worse about 5 days ago. She has had some chest tightness and "burning" in her chest "feels like there is something hanging in my throat".  Dyspnea:  Able to ex until onset  Of severe cough 5 d prior to OV   Cough: hoarse/ urge to clear throat nothing comes up/ aggravated by laughing and using voice Sleeping: at 45 degrees  SABA use: none 02: none  rec Depomedrol 120 mg IM  Take delsym two tsp every 12 hours and supplement if needed with  Tylenol #3   up to 1  every 4 hours to suppress even  the urge to cough. Swallowing water and/or using ice chips/non mint and menthol containing candies (such as lifesavers or sugarless jolly ranchers) are also effective.  You should rest your voice and avoid activities that you know make you cough. Once you have eliminated the cough for 3 straight days try reducing the Tylenol #3 first,  then the delsym as tolerated.    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   I discussed the limitations, risks, security and privacy concerns of performing an evaluation and management service by telephone and the availability of in person appointments. I also discussed with the patient that there may be a patient responsible charge related to this service. The patient expressed understanding and agreed to proceed.   History of Present Illness: Dyspnea:  Doe x room to room usually brought on in assoc with cough  Cough: globus sensation persists, says even  Tyl #3 did not stop the urge to clear throat and cough  Sleeping: neck pillow  SABA use: none  02: none    No obvious day to day or daytime variability or assoc excess/ purulent sputum or mucus plugs or hemoptysis  or cp or chest tightness, subjective wheeze or overt sinus or hb symptoms.    Also denies any obvious fluctuation of symptoms with weather or environmental changes or other aggravating or alleviating factors except as outlined above.   Meds reviewed/ med reconciliation completed         Observations/Objective: Mod hoarse, freq throat clearing but very dry sounding cough   Assessment and Plan: See problem list for active a/p's   Follow Up Instructions: See avs for instructions unique to this ov which includes revised/ updated med list     I discussed the assessment and treatment plan with the patient. The patient was provided an opportunity to ask questions and all were answered. The patient agreed with the plan and demonstrated an understanding of the instructions.   The patient was advised to call back or seek an in-person evaluation if the symptoms worsen or if the condition fails to improve as anticipated.  I provided 25 minutes of non-face-to-face time during this encounter.   Kathy HughsMichael Adonis Yim, MD           .   Past Medical History:  Diagnosis Date   Anxiety    Anxiety    Anxiety disorder    Arthritis    Hypothyroidism    Hypothyroidism    Palpitations    Palpitations    PONV (postoperative nausea and vomiting)    Thyroid disease

## 2019-03-17 NOTE — Assessment & Plan Note (Addendum)
Onset  2016 but much worse  since end Feb 2020 on wixella 500 - singulair intol - 11/19/2018  After extensive coaching inhaler device,  effectiveness =    90% > try dulera 100 2 bid and stop wixella  - Allergy profile 11/19/2018 >  Eos 191 /  IgE  23 RAST pos dog dander - Sinus CT  12/20/18 neg - CT chest 12/20/18 neg  - 0/86/7619 rec cyclical cough regimen and stop all mints  and d/c dulera - 01/20/2019 breathing fine but still daytime throat clearing > rec hard rock candy, cyclical cough rx then gabapentin 100 tid if not better > started gabapentin around Aug 1 and much better until "bad st/severe globus sensation" end of Aug 2020  - 11/08/3265 rec cyclical cough rx and titrate gabapentin up to 300 tid/ ent eval [facility] >  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    - 03/17/2019 rec max rx for gerd/ tessalon/ singulair trial awaiting initiation of trial of ST plus one more short course prednisone as usually helps some   plus reduce gabapentin to 300 mg bid due to ?cns side effects       Upper airway cough syndrome (previously labeled PNDS),  is so named because it's frequently impossible to sort out how much is  CR/sinusitis with freq throat clearing (which can be related to primary GERD)   vs  causing  secondary (" extra esophageal")  GERD from wide swings in gastric pressure that occur with throat clearing, often  promoting self use of mint and menthol lozenges that reduce the lower esophageal sphincter tone and exacerbate the problem further in a cyclical fashion.   These are the same pts (now being labeled as having "irritable larynx syndrome" by some cough centers) who not infrequently have a history of having failed to tolerate ace inhibitors,  dry powder inhalers or biphosphonates or report having atypical/extraesophageal reflux symptoms that don't respond to standard doses of PPI  and are easily confused as having aecopd or asthma flares by even experienced allergists/ pulmonologists (myself  included).   >>>  See rx as above, f/u here in 2 weeks with all meds in hand using a trust but verify approach to confirm accurate Medication  Reconciliation The principal here is that until we are certain that the  patients are doing what we've asked, it makes no sense to ask them to do more.

## 2019-03-17 NOTE — Patient Instructions (Addendum)
Reduce gabapenitn to 300 mg twice daily   Continue delsym tsp 2 tsp every 12 hours and supplement with tessalon pearls 200 mg every 6 hours  Prednisone 10 mg take  4 each am x 2 days,   2 each am x 2 days,  1 each am x 2 days and stop    GERD (REFLUX)  is an extremely common cause of respiratory symptoms just like yours , many times with no obvious heartburn at all.    It can be treated with medication, but also with lifestyle changes including elevation of the head of your bed (ideally with 6 -8inch blocks under the headboard of your bed),  Smoking cessation, avoidance of late meals, excessive alcohol, and avoid fatty foods, chocolate, peppermint, colas, red wine, and acidic juices such as orange juice.  NO MINT OR MENTHOL PRODUCTS SO NO COUGH DROPS  USE SUGARLESS CANDY INSTEAD (Jolley ranchers or Stover's or Life Savers) or even ice chips will also do - the key is to swallow to prevent all throat clearing. NO OIL BASED VITAMINS - use powdered substitutes.  Avoid fish oil when coughing.    No work until we see you in 2 weeks.   Please schedule a follow up office visit in 2 weeks, sooner if needed  with all medications /inhalers/ solutions in hand so we can verify exactly what you are taking. This includes all medications from all doctors and over the counters - add start singulair if she's never tried and failed it

## 2019-03-18 ENCOUNTER — Encounter: Payer: Self-pay | Admitting: Internal Medicine

## 2019-03-18 ENCOUNTER — Encounter: Payer: Self-pay | Admitting: *Deleted

## 2019-03-18 ENCOUNTER — Telehealth: Payer: Self-pay | Admitting: *Deleted

## 2019-03-18 MED ORDER — GABAPENTIN 300 MG PO CAPS: 300.0000 mg | ORAL_CAPSULE | Freq: Two times a day (BID) | ORAL | Status: DC

## 2019-03-18 NOTE — Telephone Encounter (Signed)
Nothing in message. Error. Will sign off.

## 2019-03-18 NOTE — Telephone Encounter (Signed)
Spoke with the pt  Appt schedule and letter done and mailed

## 2019-03-18 NOTE — Telephone Encounter (Signed)
-----   Message from Tanda Rockers, MD sent at 03/17/2019  5:27 PM EDT ----- Needs to be out of work until next ov in 2 weeks due to severe cough  Ok to write letter and schedule ov

## 2019-04-01 ENCOUNTER — Ambulatory Visit: Payer: BC Managed Care – PPO | Admitting: Internal Medicine

## 2019-04-10 ENCOUNTER — Ambulatory Visit (INDEPENDENT_AMBULATORY_CARE_PROVIDER_SITE_OTHER): Payer: BC Managed Care – PPO | Admitting: Internal Medicine

## 2019-04-10 ENCOUNTER — Other Ambulatory Visit: Payer: Self-pay

## 2019-04-10 ENCOUNTER — Encounter: Payer: Self-pay | Admitting: Internal Medicine

## 2019-04-10 DIAGNOSIS — J45991 Cough variant asthma: Secondary | ICD-10-CM | POA: Diagnosis not present

## 2019-04-10 NOTE — Progress Notes (Signed)
Kathy Martin, female    DOB: 1980-12-04,    MRN: [id number]   Brief patient profile:  65 yowf never smoker last baby 2013 with drippy nose/nasal  daily basis since and  150-160 baseline wt  and maintained this wt until around 2019 with rapid wt gain with progressive doe since and pattern of "recurrent bronchitis" dating back 2016 and bad episode of bronchitis end of Feb 2020 rx multiple ov's with abx / prednisone   Improves transiently  but flares again within days so started [facility] but never back to baseline so  referred 11/19/2018 by April Giles with w/u  RAST pos only for dog dander     History of Present Illness  11/19/2018  Pulmonary/ 1st office eval/Tonyia Marschall - last pred in march 2020  Chief Complaint  Patient presents with  . Pulmonary Consult    Referred April Giles NP Pt c/o SOB x 4 months. She states she tends to get bronchitis. Her cough is non prod.  Dyspnea:  Room to room  Cough: much better on prednisone as is the nasal drip/ could not tol singulair /worse with voice use  Sleep: min cough /on back flat one pillow /no am flares  SABA use: last saba > 18 h prior to OV  And no wixela 500 rec Pantoprazole (protonix) 40 mg   Take  30-60 min before first meal of the day and Pepcid (famotidine)  20 mg one hours before  bedtime   GERD  diet. Stop wixela and start dulera 100 Take 2 puffs first thing in am and then another 2 puffs about 12 hours later.  If not improving > Prednisone 10 mg take  4 each am x 2 days,   2 each am x 2 days,  1 each am x 2 days and stop    Televist 12/03/18 We will get you scheduled for CT chest without contrast >neg  We will also get you set up for a sinus CT>neg  Continue Dulera 100 Please start taking chlorpheniramine (aka Chlor tabs) 4 mg tablet (1 to 2 tablets at night)   12/31/2018  f/u ov/Luvenia Cranford re: refractory cough/ sob  Chief Complaint  Patient presents with  . Follow-up    Cough and SOB are not improving. She does not use her rescue inhaler bc  she states it does not help.   Dyspnea:  Doe x 25 ft, worse if talk Cough: triggered by smells/ talking / dry hacking  Sleeping: fine one pillow on back p  2 x h1 SABA use: none since on dulera but dulera seems to make her cough worse Chewing lots of orbit sugarless gum  rec Stop dulera Only use your levoalbuterol(xopenex)  as a rescue medication Take delsym two tsp every 12 hours and supplement if needed with  Tylenol #3   up to 1-2 every 4 hours  Protonix (pantoprazole) Take 30-60 min before first meal of the day and Pepcid 20 mg one bedtime plus chlorpheniramine 4 mg x 2 at bedtime (both available over the counter)  until cough is completely gone for at least a week without the need for cough suppression GERD (REFLUX)  is an extremely common cause of respiratory symptoms Depomedrol 120 mg IM   No work for two weeks then office visit with all meds in hand    01/20/2019  f/u ov/Azzie Thiem re: cough x Feb 2020/ did not take Tyl #3 , did not understand the idea was to stop all throat clearing, still using  trident gum smells of mint  Chief Complaint  Patient presents with  . Follow-up    Cough has improved some. Still having some PND and throat clearing.   Dyspnea:  Still can't talk, but activity tol  = 30 min on bike on low resistance  Cough: dry hack daytime  Sleeping: one pillow p chlorpheniramine x 2  rec Depomedrol 120 mg IM  Take delsym two tsp every 12 hours and supplement if needed with  Tylenol #3   up to 1  every 4 hours to suppress even  the urge to cough.  If not effective the next is to add gabapentin 100 mg three times a day and if not better at that we will send to [facility]  Work from home until you can stop all coughing and throat clearing  For  A week.     03/03/2019  Acute  ov/Jeancarlos Marchena re: 100% better until early august while on gerd treatment, started feeling globus sensation and started gabapentin due to urge to clear throat and felt a lot better until  02/27/19 then woke up throat burning, HA same pattern recurrence x  2016  Chief Complaint  Patient presents with  . Acute Visit    Cough was doing well until it got worse about 5 days ago. She has had some chest tightness and "burning" in her chest "feels like there is something hanging in my throat".  Dyspnea:  Able to ex until onset  Of severe cough 5 d prior to OV   Cough: hoarse/ urge to clear throat nothing comes up/ aggravated by laughing and using voice Sleeping: at 45 degrees  SABA use: none 02: none  rec Depomedrol 120 mg IM  Take delsym two tsp every 12 hours and supplement if needed with  Tylenol #3   up to 1  every 4 hours to suppress even  the urge to cough. Swallowing water and/or using ice chips/non mint and menthol containing candies (such as lifesavers or sugarless jolly ranchers) are also effective.  You should rest your voice and avoid activities that you know make you cough. Once you have eliminated the cough for 3 straight days try reducing the Tylenol #3 first,  then the delsym as tolerated.    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   I discussed the limitations, risks, security and privacy concerns of performing an evaluation and management service by telephone and the availability of in person appointments. I also discussed with the patient that there may be a patient responsible charge related to this service. The patient expressed understanding and agreed to proceed.   History of Present Illness: Dyspnea:  Doe x room to room usually brought on in assoc with cough  Cough: globus sensation persists, says even  Tyl #3 did not stop the urge to clear throat and cough  Sleeping: neck pillow  SABA use: none  02: none      Past Medical History:  Diagnosis Date  . Anxiety   . Anxiety   .  Anxiety disorder   . Arthritis   . Hypothyroidism   . Hypothyroidism   . Palpitations   . Palpitations   . PONV (postoperative nausea and vomiting)   . Thyroid disease           Observations/Objective: Mod hoarse, freq throat clearing but very dry sounding cough  rec Reduce gabapenitn to 300 mg twice daily  Continue delsym tsp 2 tsp every 12 hours and supplement with tessalon pearls 200 mg every 6 hours Prednisone 10 mg take  4 each am x 2 days,   2 each am x 2 days,  1 each am x 2 days and stop   GERD  Diet   No work until we see you in 2 weeks. Please schedule a follow up office visit in 2 weeks, sooner if needed  with all medications /inhalers/ solutions in hand so we can verify exactly what you are taking. This includes all medications from all doctors and over the counters - add start singulair if she's never tried and failed it > jittery then stopped it (note this was while on prednisone)     Virtual Visit via Telephone Note 04/10/2019   I connected with [place] on 04/10/19 at  2:00 PM EDT by telephone and verified that I am speaking with the correct person using two identifiers.   I discussed the limitations, risks, security and privacy concerns of performing an evaluation and management service by telephone and the availability of in person appointments. I also discussed with the patient that there may be a patient responsible charge related to this service. The patient expressed understanding and agreed to proceed.   History of Present Illness:  Much better  Dyspnea:  Walking ok / some sob with talking and walking but much better than it was Cough: sporadic, talking makes it worse but overall much better  Sleeping: ok s cough/ sob SABA use: none 02: none No need for singulair,  tessalon or delsym at this point/ minimal sensation of globus/ no longer using codeine    No obvious day to day or daytime variability or assoc excess/ purulent sputum or mucus plugs or hemoptysis or cp or chest tightness, subjective wheeze or overt sinus or hb symptoms.    Also denies any obvious fluctuation of symptoms with weather or environmental changes or other aggravating or alleviating factors except as outlined above.   Meds reviewed/ med reconciliation completed         Observations/Objective: Good affect, no hoarseness or increased wob   Assessment and Plan: See problem list for active a/p's   Follow Up Instructions: See avs for instructions unique to this ov which includes revised/ updated med list     I discussed the assessment and treatment plan with the patient. The patient was provided an opportunity to ask questions and all were answered. The patient agreed with the plan and demonstrated an understanding of the instructions.   The patient was advised to call back or seek an in-person evaluation if the symptoms worsen or if the condition fails to improve as anticipated.  I provided 25  minutes of non-face-to-face time during this encounter.   Christinia Gully, MD

## 2019-04-10 NOTE — Patient Instructions (Addendum)
Add singulair 10 mg each pm if cough worse and take tessalon to take as needed   Continue acid suppression and discuss with Dr Joya Gaskins   Follow with Dr Joya Gaskins and Speech therapy   Ok to return to work without restrictions effective Monday Apr 14 2019.  Pulmonary follow up is as needed  Patient has MyChart

## 2019-04-13 NOTE — Assessment & Plan Note (Signed)
Onset  2016 but much worse  since end Feb 2020 on wixella 500 - singulair intol - 11/19/2018  After extensive coaching inhaler device,  effectiveness =    90% > try dulera 100 2 bid and stop wixella  - Allergy profile 11/19/2018 >  Eos 191 /  IgE  23 RAST pos dog dander - Sinus CT  12/20/18 neg - CT chest 12/20/18 neg  - 3/41/9622 rec cyclical cough regimen and stop all mints  and d/c dulera - 01/20/2019 breathing fine but still daytime throat clearing > rec [place] candy, cyclical cough rx then gabapentin 100 tid if not better > started gabapentin around Aug 1 and much better until "bad st/severe globus sensation" end of Aug 2020 - 2/97/9892 rec cyclical cough rx and titrate gabapentin up to 300 tid/ ent eval Wright >  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    - 03/17/2019 rec max rx for gerd/ tessalon/ singulair trial awaiting initiation of trial of ST plus reduce gabapentin to 300 mg bid due to ?cns side effects   Has f/u planned with Dr Joya Gaskins / ST in meantime would add singulair trial for any flare of cough assoc with rhinitis as the "jittery feeling" she describes is much more c/w prednisone and singulair can sometimes be very effective in steroid responsive pt in terms of serving as a "steroid sparing agent"   We have not identified a pulmonary problem so ok to return to work 04/14/19 and f/u with [place] prn  Each maintenance medication was reviewed in detail including most importantly the difference between maintenance and as needed and under what circumstances the prns are to be used.  Please see AVS for specific  Instructions which are unique to this visit and I personally typed out  which were reviewed in detail over the phone with the patient and a copy provided via myChart

## 2019-04-15 ENCOUNTER — Ambulatory Visit: Payer: BC Managed Care – PPO | Admitting: Internal Medicine

## 2019-06-08 IMAGING — CR DG CHEST 2V
2 series · 2 of 2 positions shown · non-contrast
Comparison: None.

CLINICAL DATA: Pre-op respiratory exam for cervical spine fusion.

EXAM:
CHEST - 2 VIEW

[w chest pa]
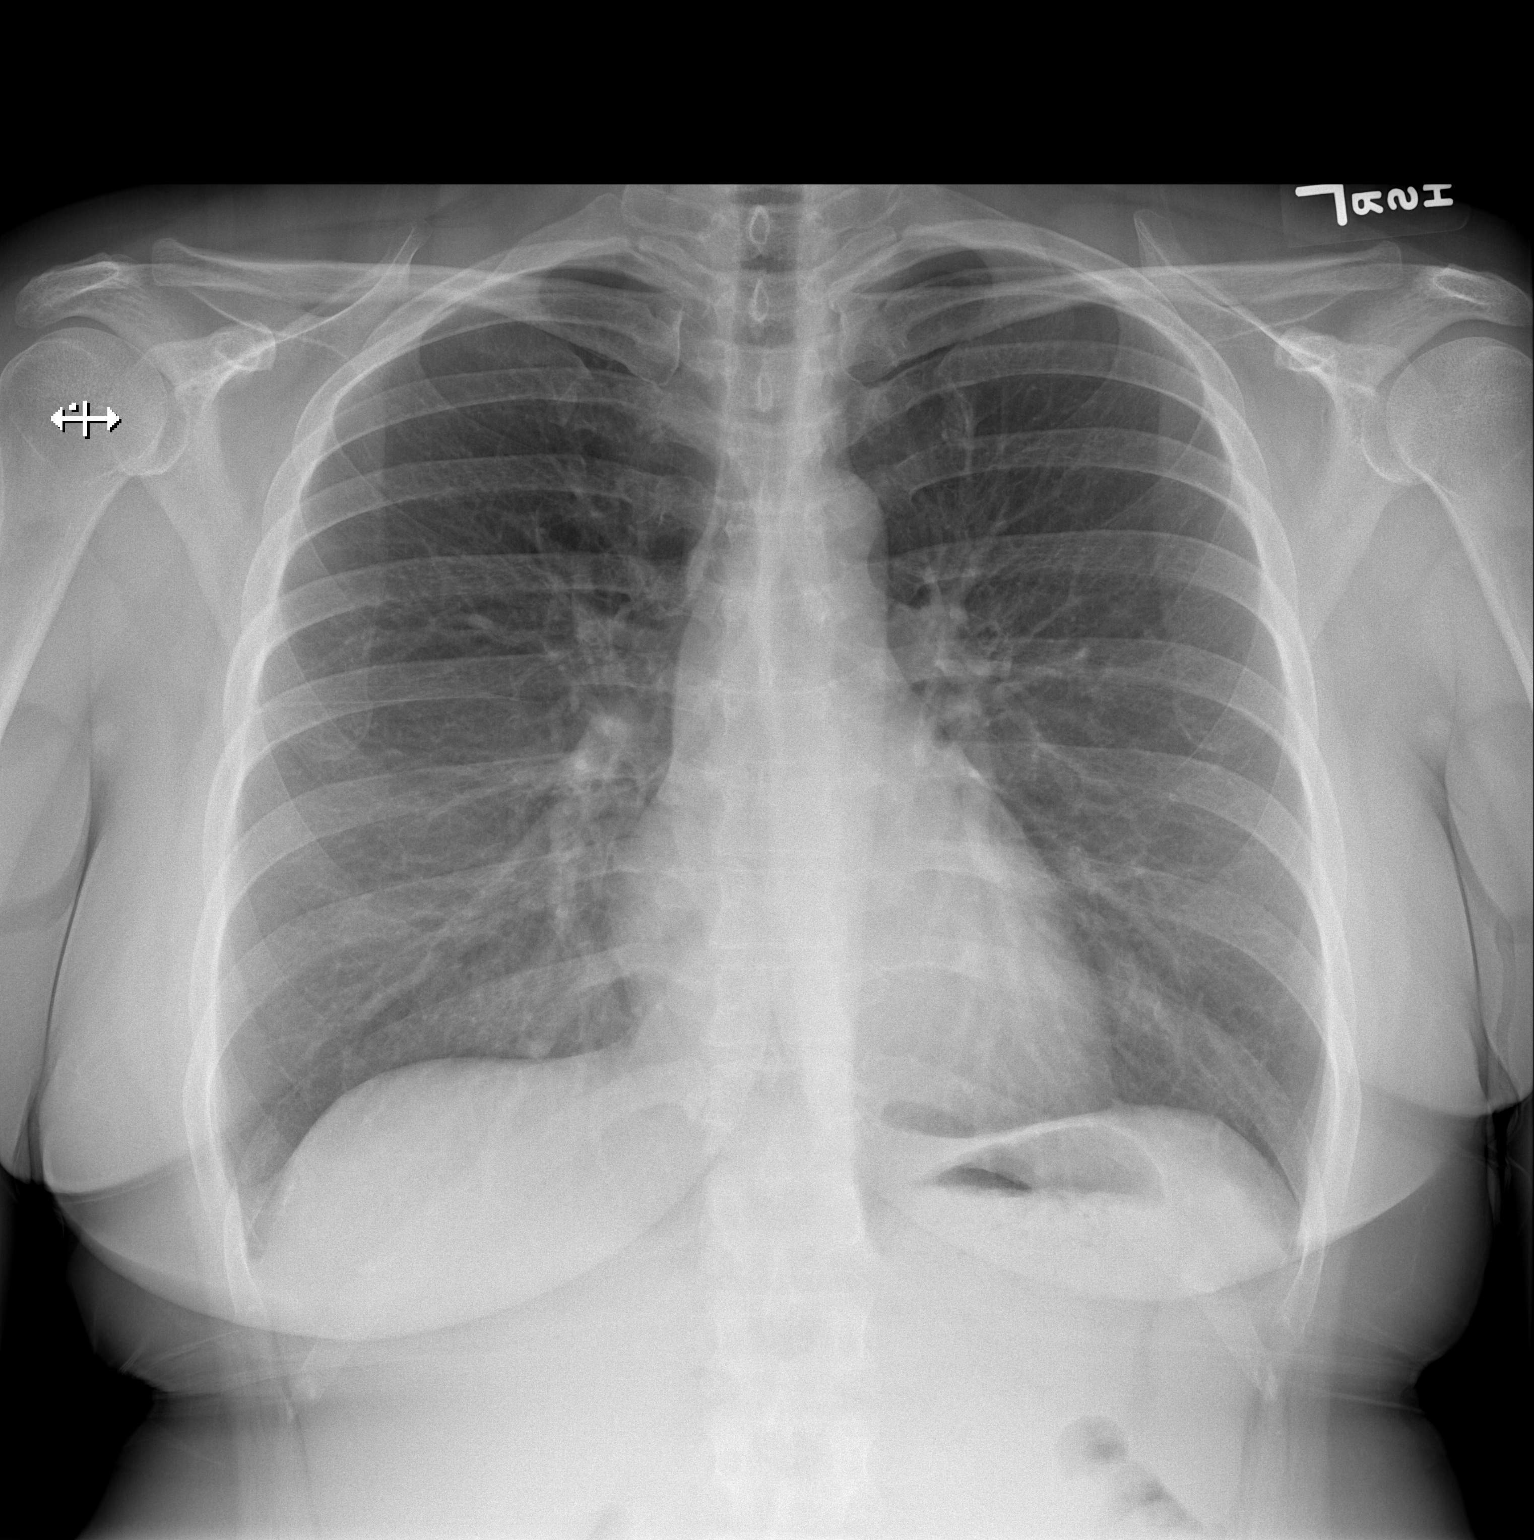

[w chest lat]
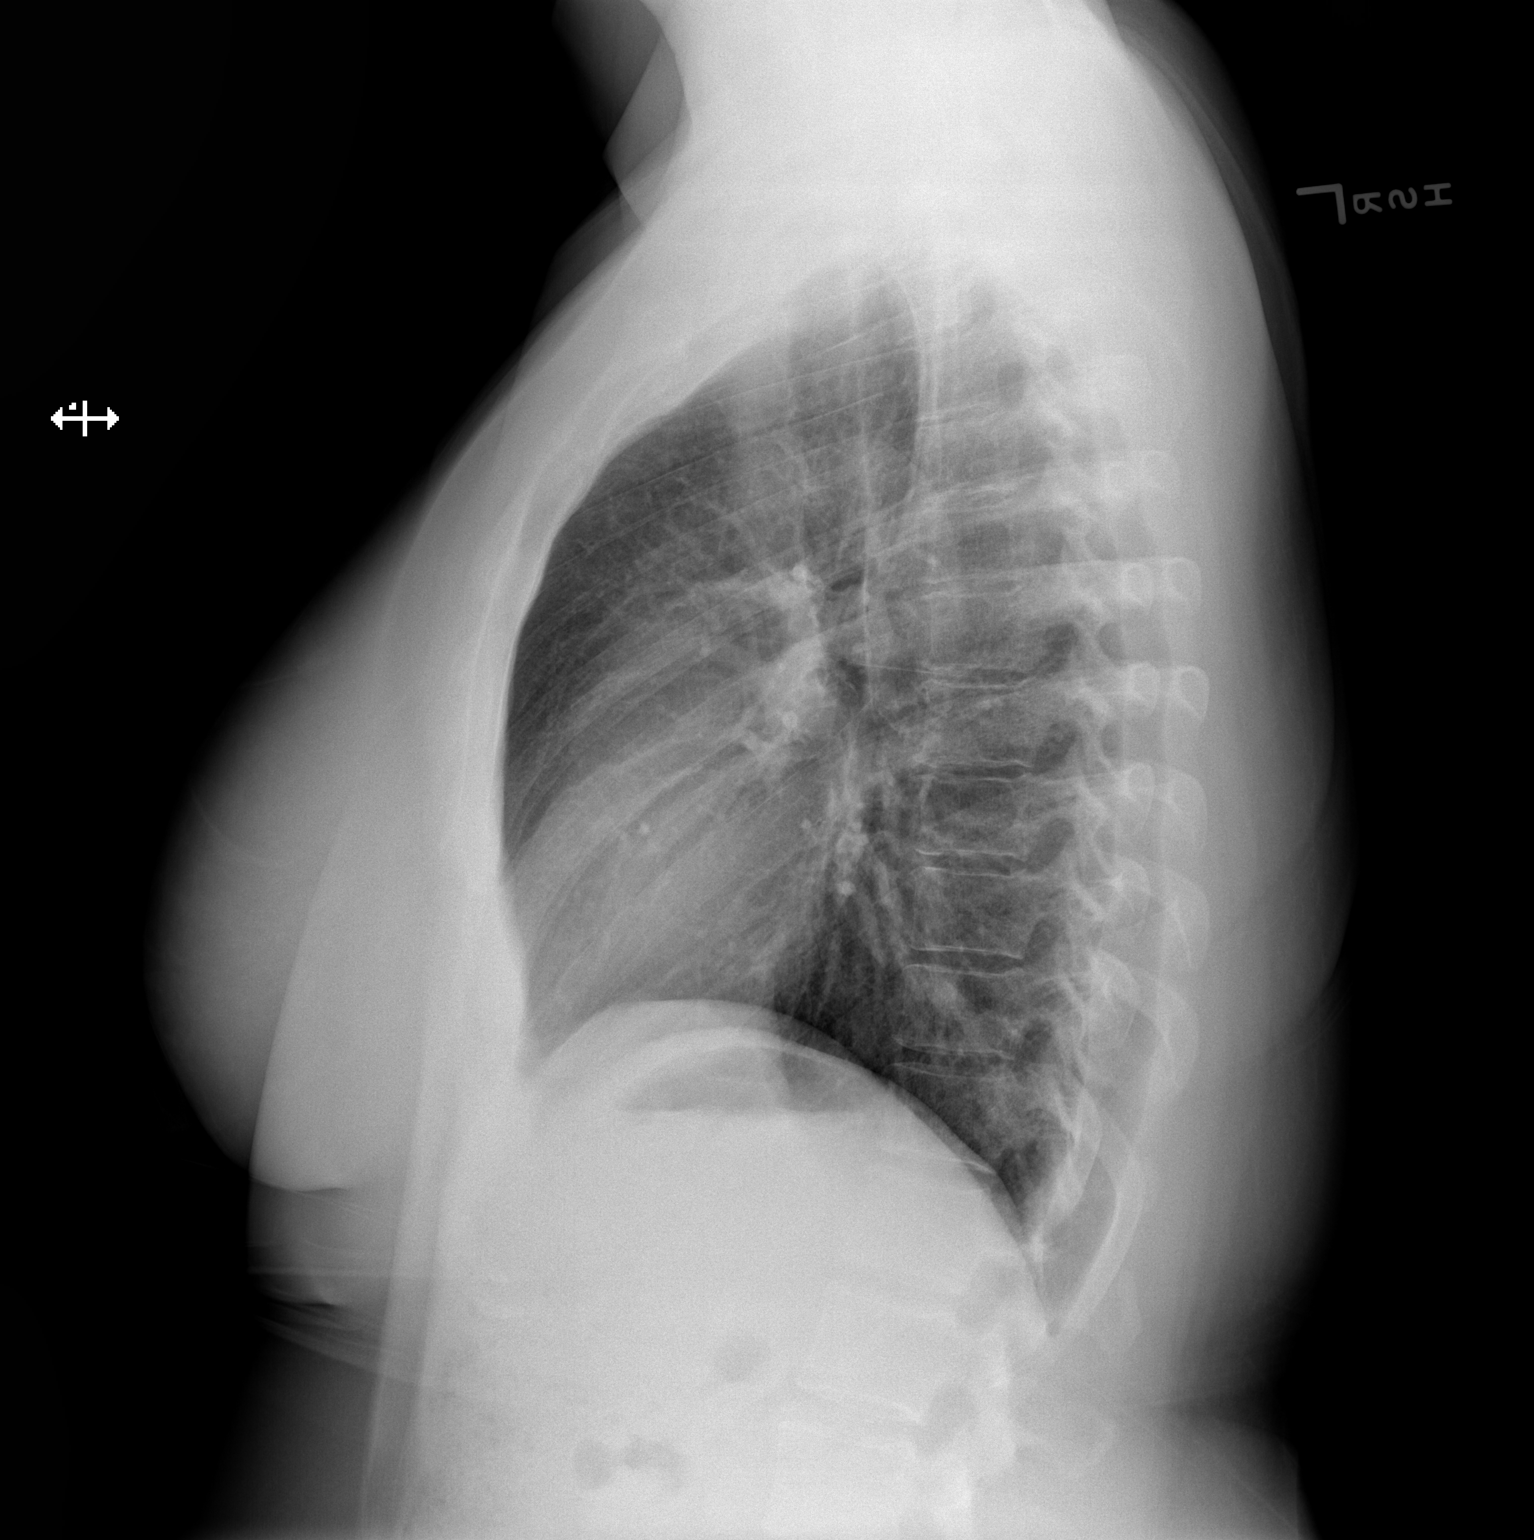

[2 of 2 positions shown; findings below may reference images not displayed]

FINDINGS: The heart size and mediastinal contours are within normal limits.
Both lungs are clear. The visualized skeletal structures are
unremarkable.
IMPRESSION: No active cardiopulmonary disease.

## 2019-11-20 ENCOUNTER — Other Ambulatory Visit: Payer: Self-pay

## 2019-11-20 ENCOUNTER — Ambulatory Visit (INDEPENDENT_AMBULATORY_CARE_PROVIDER_SITE_OTHER): Payer: BC Managed Care – PPO | Admitting: Orthopaedic Surgery

## 2019-11-20 ENCOUNTER — Encounter: Payer: Self-pay | Admitting: Orthopaedic Surgery

## 2019-11-20 ENCOUNTER — Ambulatory Visit: Payer: Self-pay

## 2019-11-20 VITALS — Ht 63.0 in | Wt 190.0 lb

## 2019-11-20 DIAGNOSIS — M542 Cervicalgia: Secondary | ICD-10-CM | POA: Diagnosis not present

## 2019-11-20 MED ORDER — GABAPENTIN 100 MG PO CAPS: 100.0000 mg | ORAL_CAPSULE | Freq: Three times a day (TID) | ORAL | 2 refills | Status: DC

## 2019-11-20 NOTE — Progress Notes (Signed)
Office Visit Note   Patient: Kathy Martin           Date of Birth: 03/20/1981           MRN: [id number] Visit Date: 11/20/2019              Requested by: Selinda Flavin, MD [address] [place],  [place] [address] PCP: Selinda Flavin, MD   Assessment & Plan: Visit Diagnoses:  1. Neck pain     Plan: We reviewed her images it does not appear that she has a pseudoarthrosis.  She does have some areas that are slightly lucent that have not totally filled in at this point.  She has not had graft resorption and has some areas on AP and lateral images that appeared to have bridged still looks like she has a solid fusion at both levels.  We will try some gabapentin 100 mg 3 times daily that she can take.  [facility].  We discussed working on a walking program to help decrease tension.  She might be able to get one of her 2 boys to go with her.  She will return if she is having persistent symptoms.  Follow-Up Instructions: No follow-ups on file.   Orders:  Orders Placed This Encounter  Procedures  . XR Cervical Spine 2 or 3 views   No orders of the defined types were placed in this encounter.     Procedures: No procedures performed   Clinical Data: No additional findings.   Subjective: Chief Complaint  Patient presents with  . Neck - Pain    HPI` 39 year old female returns she has been having problems with the pain in her neck that radiates into her shoulders along the medial aspect of the left scapula similar to her discomfort prior to her cervical fusion.  She has some gabapentin she taken when she had some swallowing problems it seemed to improve her symptoms low-dose 100 mg 3 times daily.  She denies any numbness or tingling in her hands no fever chills no gait disturbance no current swallowing problems.  She had some problems with vertigo a month ago and notes that if she extends her neck and turns her head she had some dizziness.  Review of Systems  updated unchanged from July 22, 2012 point system update   Objective: Vital Signs: Ht 5\' 3"  (1.6 m)   Wt 190 lb (86.2 kg)   BMI 33.66 kg/m   Physical Exam Constitutional:      Appearance: She is well-developed.  HENT:     Head: Normocephalic.     Right Ear: External ear normal.     Left Ear: External ear normal.  Eyes:     Pupils: Pupils are equal, round, and reactive to light.  Neck:     Thyroid: No thyromegaly.     Trachea: No tracheal deviation.  Cardiovascular:     Rate and Rhythm: Normal rate.  Pulmonary:     Effort: Pulmonary effort is normal.  Abdominal:     Palpations: Abdomen is soft.  Skin:    General: Skin is warm and dry.  Neurological:     Mental Status: She is alert and oriented to person, place, and time.  Psychiatric:        Behavior: Behavior normal.     Ortho Exam patient has 2+ biceps triceps brachial radialis reflex right and left minimal brachial plexus tenderness some tenderness over the trapezial muscle.  Tenderness  along the medial border the scapula right and left no winging of the scapula negative impingement right and left shoulder long head of the biceps is normal.  No atrophy the upper extremities normal heel toe gait.  Specialty Comments:  No specialty comments available.  Imaging: No results found.   PMFS History: Patient Active Problem List   Diagnosis Date Noted  . DOE (dyspnea on exertion) 12/31/2018  . Upper airway cough syndrome 12/03/2018  . Allergic rhinitis 12/03/2018  . 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 Hx of fusion of cervical spine 06/21/2018   Past Medical History:  Diagnosis Date  . Anxiety   . Anxiety   . Anxiety disorder   . Arthritis   . Hypothyroidism   . Hypothyroidism   . Palpitations   . Palpitations   . PONV (postoperative nausea and vomiting)   . Thyroid disease     No family history on file.  Past Surgical History:  Procedure Laterality Date  . ANTERIOR CERVICAL DECOMP/DISCECTOMY  FUSION N/A 06/07/2018   Procedure: C5-6, C-6-7 ANTERIOR CERVICAL DECOMPRESSION/DISCECTOMY FUSION, ALLOGRAFT, PLATE;  Surgeon: Marybelle Killings, MD;  Location: [facility];  Service: Orthopedics;  Laterality: N/A;  . BREAST REDUCTION SURGERY  03/2013  . CESAREAN SECTION  2011, 2013   x2  . DILATION AND CURETTAGE OF UTERUS    . GANGLION CYST EXCISION     Right  . OOPHORECTOMY  2010   rt   Social History   Occupational History  . Not on file  Tobacco Use  . Smoking status: Never Smoker  . Smokeless tobacco: Never Used  Substance and Sexual Activity  . Alcohol use: Yes    Alcohol/week: 0.0 standard drinks    Comment: occ  . Drug use: Never  . Sexual activity: Not on file

## 2019-12-25 IMAGING — CT CT PARANASAL SINUS LIMITED WITHOUT CONTRAST
3 series · 13 of 47 positions shown, 15 images · non-contrast
Comparison: None.

CLINICAL DATA: Shortness of breath over the last year. Cough.
Asthma.

EXAM:
CT PARANASAL SINUS LIMITED WITHOUT CONTRAST
TECHNIQUE: Multidetector CT images of the paranasal sinuses were obtained using
the standard protocol without intravenous contrast.

[Series 3: ax soft · axial · 0.37mm/px · z∈[-97,-11]mm · 7 of 53 slices shown, 9 images]
[im 6/53  brain]
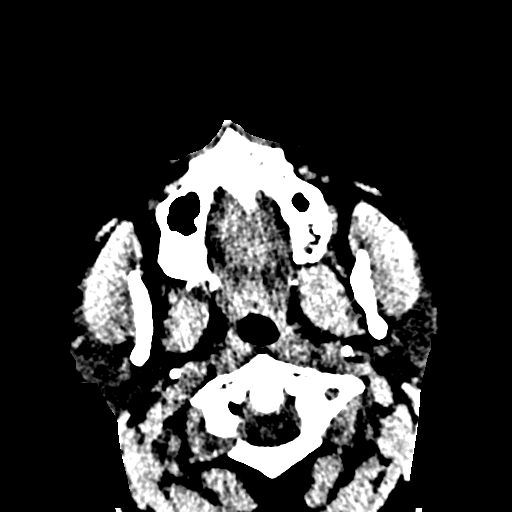
[im 6/53  bone]
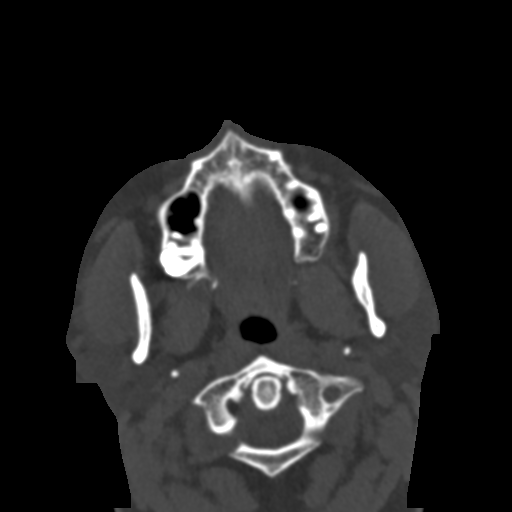
[im 13/53  bone]
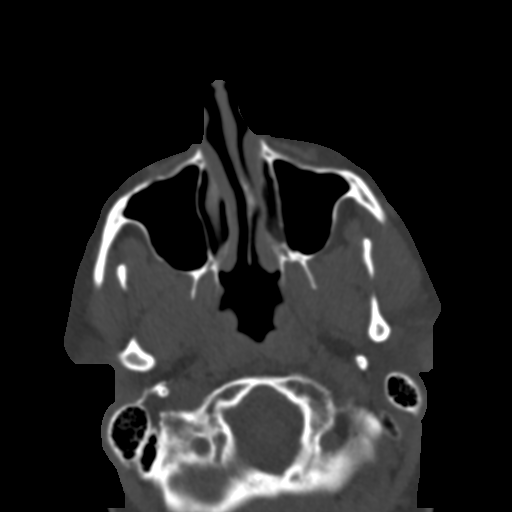
[im 20/53  bone]
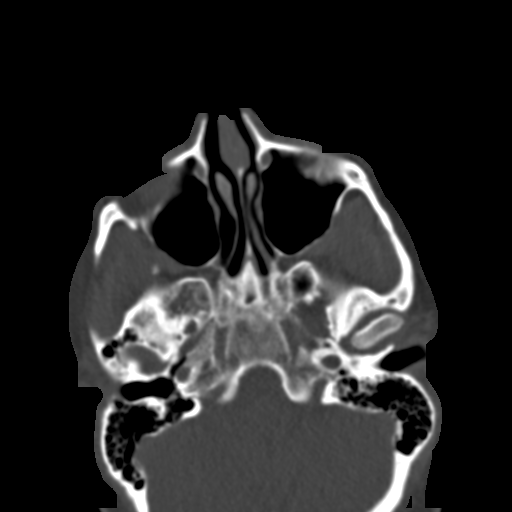
[im 27/53  bone]
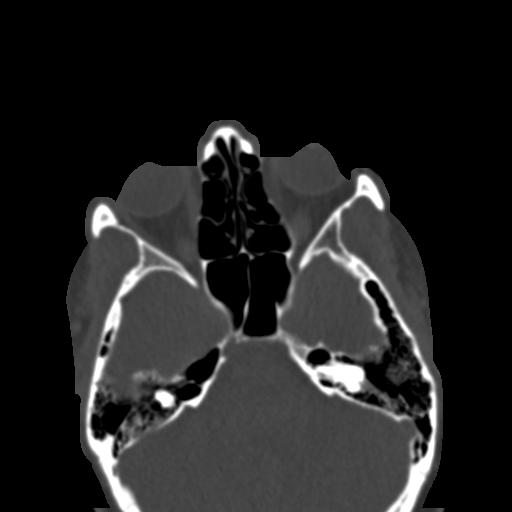
[im 35/53  brain]
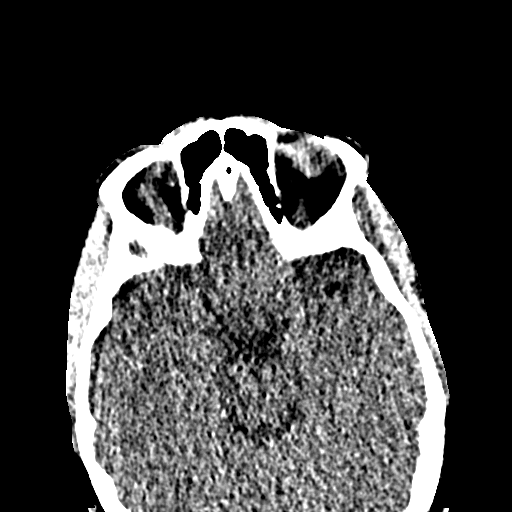
[im 35/53  bone]
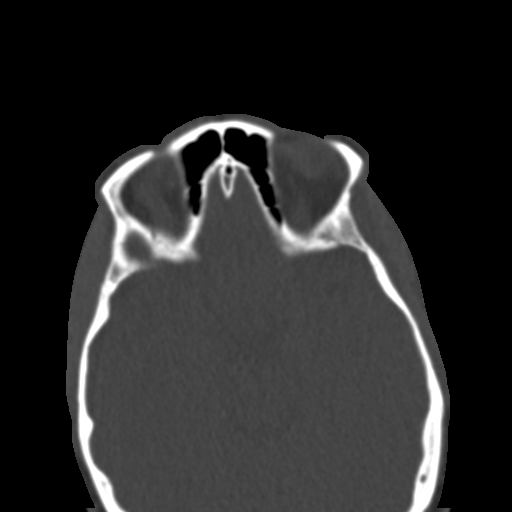
[im 42/53  bone]
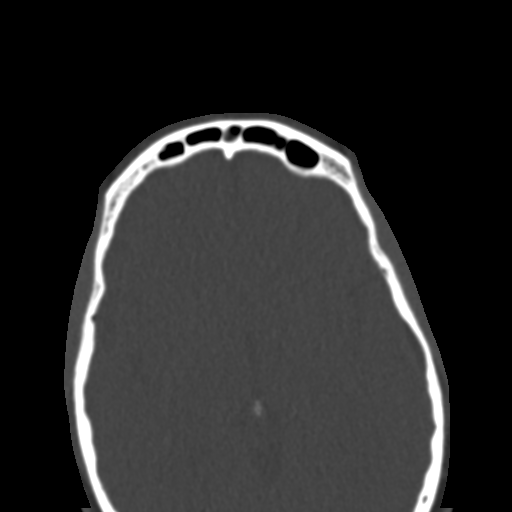
[im 49/53  bone]
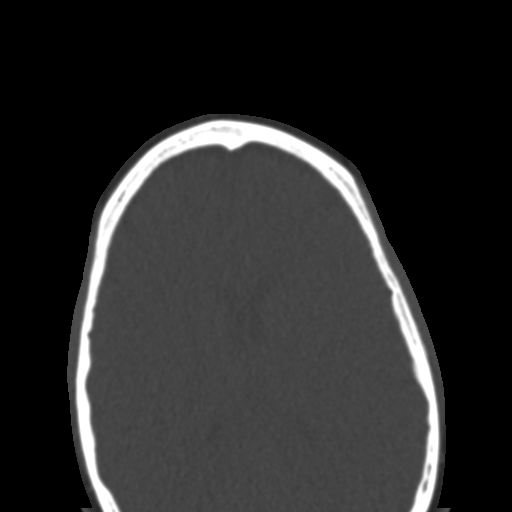

[Series 4: coronal bone · coronal · 0.20mm/px · 3 of 73 slices shown]
[im 25/73  bone]
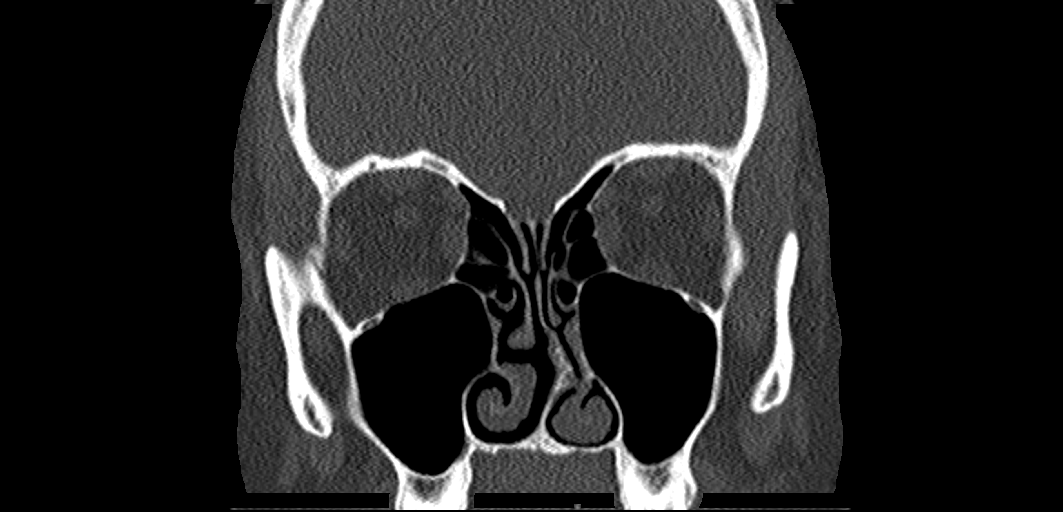
[im 33/73  bone]
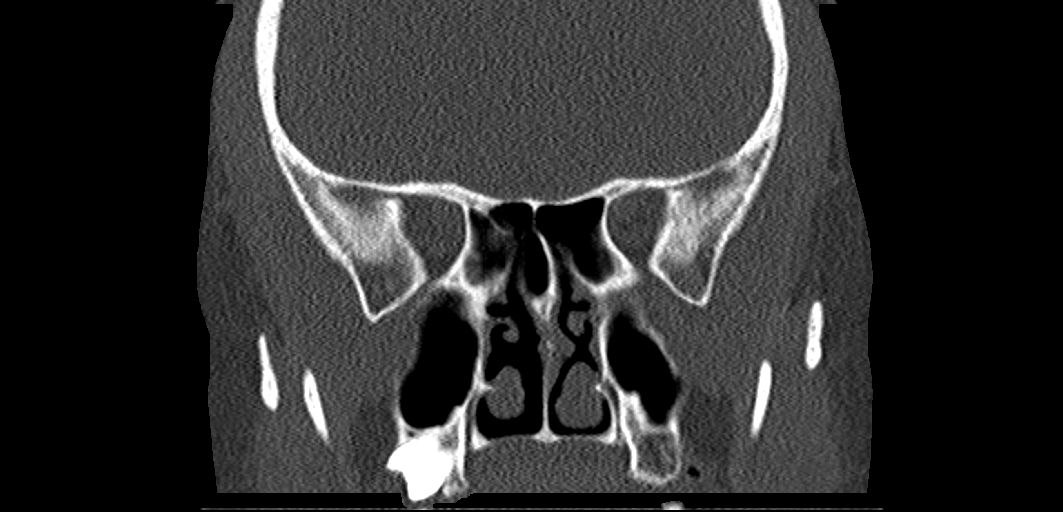
[im 41/73  bone]
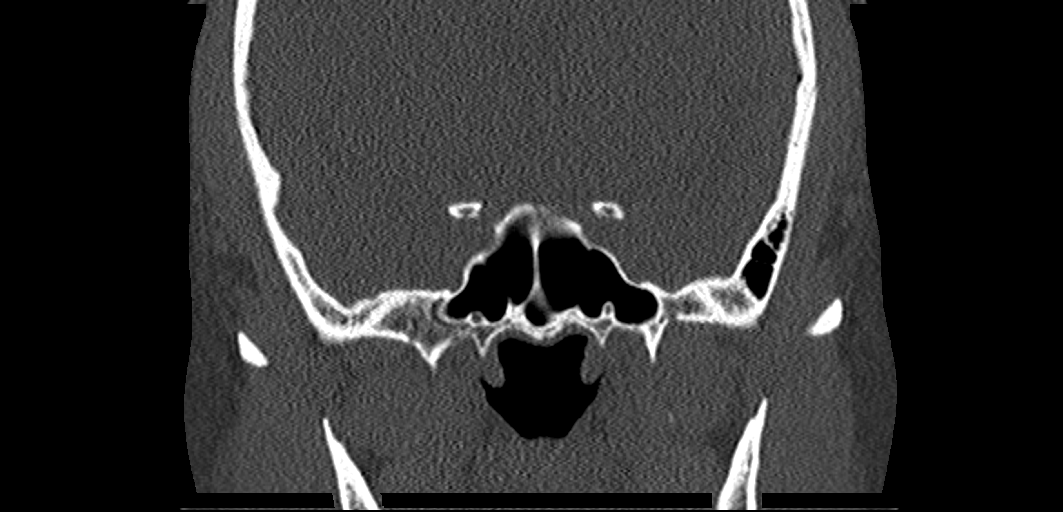

[Series 5: sagittal bone · sagittal · 0.22mm/px · 3 of 97 slices shown]
[im 33/97  bone]
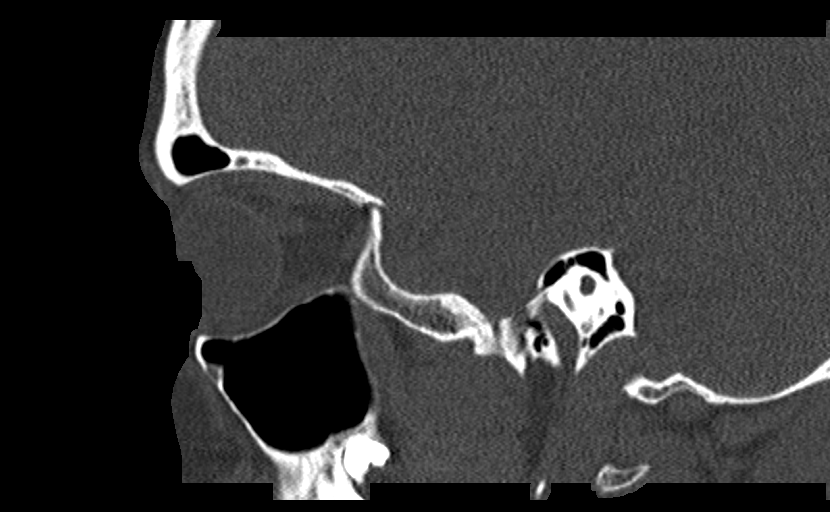
[im 49/97  bone]
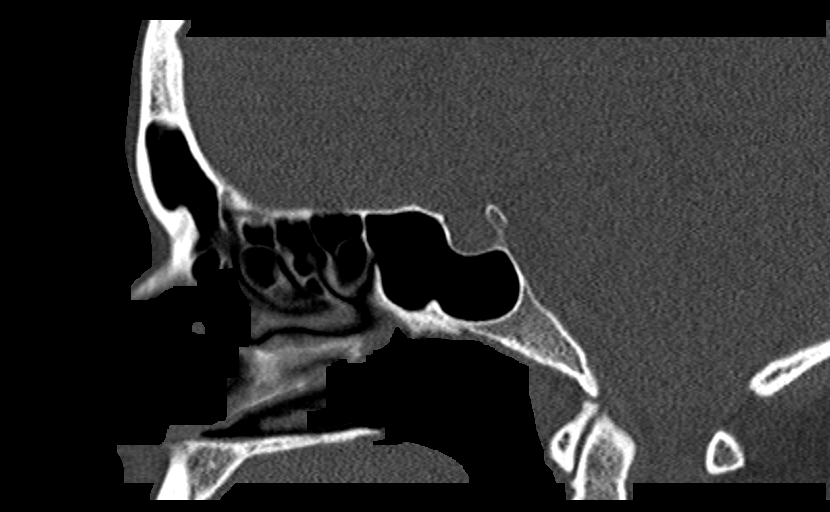
[im 65/97  bone]
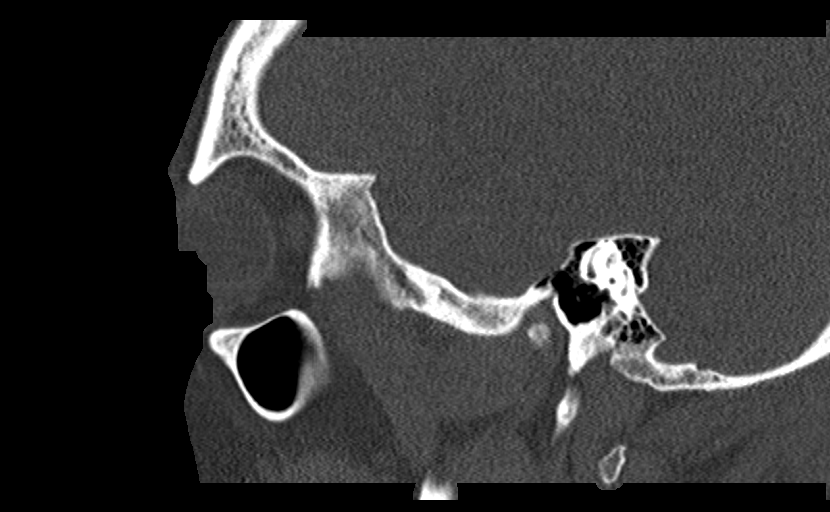

[13 of 47 positions shown; findings below may reference images not displayed]

FINDINGS: Paranasal sinuses:

Frontal: Normally aerated. Patent frontal sinus drainage pathways.

Ethmoid: Normally aerated.

Maxillary: Normally aerated.

Sphenoid: Normally aerated. Patent sphenoethmoidal recesses.

Right ostiomeatal unit: Patent.

Left ostiomeatal unit: Patent.

Nasal passages: Patent. Nasal septum bows 4 mm towards the left with
a left spur. Relative narrowing of the left nasal passages compared
to the right.

Anatomy: There is pneumatization superior to both anterior ethmoid
notches. Asymmetrical factory grooves, wider on the left than the
right. Depth is 5 mm. Sellar sphenoid pneumatization pattern. No
dehiscence of carotid or optic canals. No onodi cell.

Other: Unerupted anomalous wisdom tooth on the right in the maxilla.
IMPRESSION: No evidence of sinus disease. No significant anatomic variation.
Unerupted or partially erupted right maxillary wisdom tooth.

## 2022-08-02 ENCOUNTER — Ambulatory Visit: Payer: BC Managed Care – PPO | Admitting: Gastroenterology

## 2022-08-02 ENCOUNTER — Encounter: Payer: Self-pay | Admitting: Gastroenterology

## 2022-08-02 VITALS — BP 110/72 | HR 92 | Ht 63.0 in | Wt 151.0 lb

## 2022-08-02 DIAGNOSIS — K8020 Calculus of gallbladder without cholecystitis without obstruction: Secondary | ICD-10-CM | POA: Diagnosis not present

## 2022-08-02 DIAGNOSIS — R110 Nausea: Secondary | ICD-10-CM

## 2022-08-02 DIAGNOSIS — R140 Abdominal distension (gaseous): Secondary | ICD-10-CM | POA: Diagnosis not present

## 2022-08-02 DIAGNOSIS — R1011 Right upper quadrant pain: Secondary | ICD-10-CM | POA: Diagnosis not present

## 2022-08-02 DIAGNOSIS — R194 Change in bowel habit: Secondary | ICD-10-CM

## 2022-08-02 DIAGNOSIS — N200 Calculus of kidney: Secondary | ICD-10-CM

## 2022-08-02 NOTE — Progress Notes (Signed)
Chief Complaint: Cholelithiasis, nausea, change in bowel habits   Referring Provider:     Self   HPI:     Kathy Martin is a 42 y.o. female referred to the [facility] for evaluation of cholelithiasis, nausea, bloating, change in bowel habits.  Years of "GI issues". Will have diarrhea x1 week with abdominal bloating and visible distension, then resolves. Has a few episodes/year.  Has not pinpointed any specific food triggers or other exacerbating features.  Most recently had an episode in late December but with assocaited white colored stools then constipation, nausea w/o emesis, right back pain, and now RUQ pain over last couple days.  Saw PCM 2 weeks ago with these sxs.  Labs were unremarkable to include normal CBC, CMP, TSH (viewable on her phone today), but UA reportedly with hematuria.  She was then sent for renal ultrasound which was notable for 8 mm right kidney stone and cholelithiasis (ultrasound report reviewed on her phone).  Patient reports that the ultrasound tech told her she had a 10 cm GB stone (again, report only says cholelithiasis).    Taking Zofran now which largely controls the nausea.   No previous EGD or colonoscopy.  Past Medical History:  Diagnosis Date   Anxiety    Arthritis    Gallstone    Hypothyroidism    Kidney stones    Obesity    Palpitations    PONV (postoperative nausea and vomiting)      Past Surgical History:  Procedure Laterality Date   ANTERIOR CERVICAL DECOMP/DISCECTOMY FUSION N/A 06/07/2018   Procedure: C5-6, C-6-7 ANTERIOR CERVICAL DECOMPRESSION/DISCECTOMY FUSION, ALLOGRAFT, PLATE;  Surgeon: Marybelle Killings, MD;  Location: [facility];  Service: Orthopedics;  Laterality: N/A;   BREAST REDUCTION SURGERY  03/2013   CESAREAN SECTION  2011, 2013   x2   DILATION AND CURETTAGE OF UTERUS     GANGLION CYST EXCISION     Right   mucinous tumor  2010   OOPHORECTOMY  2010   rt   Family History  Problem Relation Age of  Onset   Barrett's esophagus Maternal Grandmother    Colon cancer Neg Hx    Esophageal cancer Neg Hx    Social History   Tobacco Use   Smoking status: Never   Smokeless tobacco: Never  Vaping Use   Vaping Use: Never used  Substance Use Topics   Alcohol use: Not Currently   Drug use: Never   Current Outpatient Medications  Medication Sig Dispense Refill   acetaminophen-codeine (TYLENOL #3) 300-30 MG tablet Take 1-2 tablets by mouth every 4 (four) hours as needed (cough). 40 tablet 0   ALPRAZolam (XANAX) 0.25 MG tablet Take 0.25 mg by mouth daily as needed for anxiety.     FLUoxetine (PROZAC) 20 MG capsule Take 20 mg by mouth daily.      levothyroxine (SYNTHROID, LEVOTHROID) 50 MCG tablet Take 50 mcg by mouth daily before breakfast.     Polyethyl Glycol-Propyl Glycol (SYSTANE ULTRA) 0.4-0.3 % SOLN Place 1 drop into both eyes daily.     No current facility-administered medications for this visit.   No Known Allergies   Review of Systems: All systems reviewed and negative except where noted in HPI.     Physical Exam:    Wt Readings from Last 3 Encounters:  08/02/22 151 lb (68.5 kg)  11/20/19 190 lb (86.2 kg)  03/03/19 196 lb (88.9 kg)  BP 110/72   Pulse 92   Ht 5\' 3"  (1.6 m)   Wt 151 lb (68.5 kg)   BMI 26.75 kg/m  Constitutional:  Pleasant, in no acute distress. Psychiatric: Normal mood and affect. Behavior is normal. Cardiovascular: Normal rate, regular rhythm. No edema Pulmonary/chest: Effort normal and breath sounds normal. No wheezing, rales or rhonchi. Abdominal: Mild TTP in RUQ with equivocal Murphy sign.  Abdomen otherwise soft, nondistended. Bowel sounds active throughout. There are no masses palpable. No hepatomegaly. Neurological: Alert and oriented to person place and time. Skin: Skin is warm and dry. No rashes noted.   ASSESSMENT AND PLAN;   1) Nausea without emesis 2) RUQ pain 3) Abdominal bloating 4) Cholelithiasis on ultrasound Unclear of the  significance of the cholelithiasis found on ultrasound.  Very brief ultrasound report only remarks "gallstones noted", but patient is reporting there was a 10 cm finding in the RUQ.  Liver enzymes otherwise normal.  We discussed the possible DDx for a large lesion in the RUQ at length today.  Given ongoing symptoms and unclear findings, plan for the following: - CT abdomen/pelvis - EGD to evaluate for medical/luminal pathology given ongoing/relapsing UGI symptoms  5) Change in bowel habits - If CT and EGD unrevealing, will plan for fecal fat, fecal pancreatic elastase when diarrhea and acholic stools return  6) Nephrolithiasis - Would benefit from referral to Urology  The indications, risks, and benefits of EGD were explained to the patient in detail. Risks include but are not limited to bleeding, perforation, adverse reaction to medications, and cardiopulmonary compromise. Sequelae include but are not limited to the possibility of surgery, hospitalization, and mortality. The patient verbalized understanding and wished to proceed. All questions answered, referred to scheduler. Further recommendations pending results of the exam.    Dominic Pea Tyheem Boughner, DO, FACG  08/02/2022, 11:50 AM   No ref. provider found

## 2022-08-02 NOTE — Patient Instructions (Addendum)
You have been scheduled for a CT scan of the abdomen and pelvis at [facility], 1st floor Radiology. You are scheduled on 08/09/22 at 500. You should arrive 2 hours prior to your appointment time for registration and to drink contrast.  You may take any medications as prescribed with a small amount of water, if necessary. If you take any of the following medications: METFORMIN, GLUCOPHAGE, GLUCOVANCE, AVANDAMET, RIOMET, FORTAMET, [place] MET, JANUMET, GLUMETZA or METAGLIP, you MAY be asked to HOLD this medication 48 hours AFTER the exam.   The purpose of you drinking the oral contrast is to aid in the visualization of your intestinal tract. The contrast solution may cause some diarrhea. Depending on your individual set of symptoms, you may also receive an intravenous injection of x-ray contrast/dye. Plan on being at [facility] for 45 minutes or longer, depending on the type of exam you are having performed.   If you have any questions regarding your exam or if you need to reschedule, you may call [facility] at [phone] between the hours of 8:00 am and 5:00 pm, Monday-Friday.    You have been scheduled for an endoscopy. Please follow written instructions given to you at your visit today. If you use inhalers (even only as needed), please bring them with you on the day of your procedure.   _______________________________________________________  If your blood pressure at your visit was 140/90 or greater, please contact your primary care physician to follow up on this.  _______________________________________________________  If you are age 42 or younger, your body mass index should be between 19-25. Your Body mass index is 26.75 kg/m. If this is out of the aformentioned range listed, please consider follow up with your Primary Care Provider.   __________________________________________________________  The [facility] providers would like to encourage you to use [facility] to  communicate with providers for non-urgent requests or questions.  Due to long hold times on the telephone, sending your provider a message by [facility] may be a faster and more efficient way to get a response.  Please allow 48 business hours for a response.  Please remember that this is for non-urgent requests.   Due to recent changes in healthcare laws, you may see the results of your imaging and laboratory studies on MyChart before your provider has had a chance to review them.  We understand that in some cases there may be results that are confusing or concerning to you. Not all laboratory results come back in the same time frame and the provider may be waiting for multiple results in order to interpret others.  Please give [place] 48 hours in order for your provider to thoroughly review all the results before contacting the office for clarification of your results.     Thank you for choosing me and [facility].  Vito Cirigliano, D.O.

## 2022-08-03 ENCOUNTER — Ambulatory Visit (AMBULATORY_SURGERY_CENTER): Payer: BC Managed Care – PPO | Admitting: Gastroenterology

## 2022-08-03 ENCOUNTER — Encounter: Payer: Self-pay | Admitting: Gastroenterology

## 2022-08-03 VITALS — BP 103/69 | HR 94 | Temp 96.9°F | Resp 14 | Ht 63.0 in | Wt 151.0 lb

## 2022-08-03 DIAGNOSIS — K317 Polyp of stomach and duodenum: Secondary | ICD-10-CM | POA: Diagnosis present

## 2022-08-03 DIAGNOSIS — R1011 Right upper quadrant pain: Secondary | ICD-10-CM

## 2022-08-03 DIAGNOSIS — R194 Change in bowel habit: Secondary | ICD-10-CM

## 2022-08-03 DIAGNOSIS — K8020 Calculus of gallbladder without cholecystitis without obstruction: Secondary | ICD-10-CM

## 2022-08-03 DIAGNOSIS — R110 Nausea: Secondary | ICD-10-CM

## 2022-08-03 DIAGNOSIS — R140 Abdominal distension (gaseous): Secondary | ICD-10-CM

## 2022-08-03 MED ORDER — SODIUM CHLORIDE 0.9 % IV SOLN: 500.0000 mL | INTRAVENOUS | Status: DC

## 2022-08-03 NOTE — Patient Instructions (Signed)
Please read handouts provided. Continue present medications. Await pathology results. Proceed with CT as scheduled. Return to GI clinic.   YOU HAD AN ENDOSCOPIC PROCEDURE TODAY AT [facility]:   Refer to the procedure report that was given to you for any specific questions about what was found during the examination.  If the procedure report does not answer your questions, please call your gastroenterologist to clarify.  If you requested that your care partner not be given the details of your procedure findings, then the procedure report has been included in a sealed envelope for you to review at your convenience later.  YOU SHOULD EXPECT: Some feelings of bloating in the abdomen. Passage of more gas than usual.  Walking can help get rid of the air that was put into your GI tract during the procedure and reduce the bloating. If you had a lower endoscopy (such as a colonoscopy or flexible sigmoidoscopy) you may notice spotting of blood in your stool or on the toilet paper. If you underwent a bowel prep for your procedure, you may not have a normal bowel movement for a few days.  Please Note:  You might notice some irritation and congestion in your nose or some drainage.  This is from the oxygen used during your procedure.  There is no need for concern and it should clear up in a day or so.  SYMPTOMS TO REPORT IMMEDIATELY:  Following upper endoscopy (EGD)  Vomiting of blood or coffee ground material  New chest pain or pain under the shoulder blades  Painful or persistently difficult swallowing  New shortness of breath  Fever of 100F or higher  Black, tarry-looking stools  For urgent or emergent issues, a gastroenterologist can be reached at any hour by calling [phone]. Do not use MyChart messaging for urgent concerns.    DIET:  We do recommend a small meal at first, but then you may proceed to your regular diet.  Drink plenty of fluids but you should avoid alcoholic  beverages for 24 hours.  ACTIVITY:  You should plan to take it easy for the rest of today and you should NOT DRIVE or use heavy machinery until tomorrow (because of the sedation medicines used during the test).    FOLLOW UP: Our staff will call the number listed on your records the next business day following your procedure.  We will call around 7:15- 8:00 am to check on you and address any questions or concerns that you may have regarding the information given to you following your procedure. If we do not reach you, we will leave a message.     If any biopsies were taken you will be contacted by phone or by letter within the next 1-3 weeks.  Please call us at [phone] if you have not heard about the biopsies in 3 weeks.    SIGNATURES/CONFIDENTIALITY: You and/or your care partner have signed paperwork which will be entered into your electronic medical record.  These signatures attest to the fact that that the information above on your After Visit Summary has been reviewed and is understood.  Full responsibility of the confidentiality of this discharge information lies with you and/or your care-partner.

## 2022-08-03 NOTE — Progress Notes (Signed)
Called to room to assist during endoscopic procedure.  Patient ID and intended procedure confirmed with present staff. Received instructions for my participation in the procedure from the performing physician.  

## 2022-08-03 NOTE — Progress Notes (Signed)
Pt resting comfortably. VSS. Airway intact. SBAR complete to RN. All questions answered.   

## 2022-08-03 NOTE — Progress Notes (Signed)
Pt's states no medical or surgical changes since previsit or office visit. 

## 2022-08-03 NOTE — Progress Notes (Signed)
Good respiratory effort and palpable exhalation during exam. Poor ETCO2 waveform per nasal cannula.

## 2022-08-03 NOTE — Progress Notes (Signed)
GASTROENTEROLOGY PROCEDURE H&P NOTE   Primary Care Physician: Denny Levy, PA    Reason for Procedure:  Nausea, abdominal bloating, change in bowel habits, RUQ pain  Plan:    EGD  Patient is appropriate for endoscopic procedure(s) in the ambulatory ([facility]) setting.  The nature of the procedure, as well as the risks, benefits, and alternatives were carefully and thoroughly reviewed with the patient. Ample time for discussion and questions allowed. The patient understood, was satisfied, and agreed to proceed.     HPI: Kathy Martin is a 42 y.o. female who presents for EGD for evaluation of RUQ pain, nausea, abdominal bloating, change in bowel habits.  Patient was most recently seen in the Gastroenterology Clinic on 08/02/2022 by me.  No interval change in medical history since that appointment. Please refer to that note for full details regarding GI history and clinical presentation.   Past Medical History:  Diagnosis Date   Anxiety    Arthritis    Gallstone    Hypothyroidism    Kidney stones    Obesity    Palpitations    PONV (postoperative nausea and vomiting)     Past Surgical History:  Procedure Laterality Date   ANTERIOR CERVICAL DECOMP/DISCECTOMY FUSION N/A 06/07/2018   Procedure: C5-6, C-6-7 ANTERIOR CERVICAL DECOMPRESSION/DISCECTOMY FUSION, ALLOGRAFT, PLATE;  Surgeon: Marybelle Killings, MD;  Location: [facility];  Service: Orthopedics;  Laterality: N/A;   BREAST REDUCTION SURGERY  03/2013   CESAREAN SECTION  2011, 2013   x2   DILATION AND CURETTAGE OF UTERUS     GANGLION CYST EXCISION     Right   mucinous tumor  2010   OOPHORECTOMY  2010   rt    Prior to Admission medications   Medication Sig Start Date End Date Taking? Authorizing Provider  acetaminophen-codeine (TYLENOL #3) 300-30 MG tablet Take 1-2 tablets by mouth every 4 (four) hours as needed (cough). 12/31/18   Tanda Rockers, MD  ALPRAZolam Duanne Moron) 0.25 MG tablet Take 0.25 mg by mouth daily as needed for  anxiety.    [provider]  FLUoxetine (PROZAC) 20 MG capsule Take 20 mg by mouth daily.  10/04/17   [provider]  levothyroxine (SYNTHROID, LEVOTHROID) 50 MCG tablet Take 50 mcg by mouth daily before breakfast.    [provider]  Polyethyl Glycol-Propyl Glycol (SYSTANE ULTRA) 0.4-0.3 % SOLN Place 1 drop into both eyes daily.    [provider]    Current Outpatient Medications  Medication Sig Dispense Refill   acetaminophen-codeine (TYLENOL #3) 300-30 MG tablet Take 1-2 tablets by mouth every 4 (four) hours as needed (cough). 40 tablet 0   ALPRAZolam (XANAX) 0.25 MG tablet Take 0.25 mg by mouth daily as needed for anxiety.     FLUoxetine (PROZAC) 20 MG capsule Take 20 mg by mouth daily.      levothyroxine (SYNTHROID, LEVOTHROID) 50 MCG tablet Take 50 mcg by mouth daily before breakfast.     Polyethyl Glycol-Propyl Glycol (SYSTANE ULTRA) 0.4-0.3 % SOLN Place 1 drop into both eyes daily.     Current Facility-Administered Medications  Medication Dose Route Frequency Provider Last Rate Last Admin   0.9 %  sodium chloride infusion  500 mL Intravenous Continuous Markos Theil V, DO        Allergies as of 08/03/2022   (No Known Allergies)    Family History  Problem Relation Age of Onset   Barrett's esophagus Maternal Grandmother    Colon cancer Neg Hx  Esophageal cancer Neg Hx     Social History   Socioeconomic History   Marital status: Married    Spouse name: Edison Nasuti   Number of children: 2   Years of education: Not on file   Highest education level: Not on file  Occupational History   Occupation: Quarry manager  Tobacco Use   Smoking status: Never   Smokeless tobacco: Never  Vaping Use   Vaping Use: Never used  Substance and Sexual Activity   Alcohol use: Not Currently   Drug use: Never   Sexual activity: Not on file  Other Topics Concern   Not on file  Social History Narrative   Not on file   Social Determinants of Health    Financial Resource Strain: Not on file  Food Insecurity: Not on file  Transportation Needs: Not on file  Physical Activity: Not on file  Stress: Not on file  Social Connections: Not on file  Intimate Partner Violence: Not on file    Physical Exam: Vital signs in last 24 hours: @There  were no vitals taken for this visit. GEN: NAD EYE: Sclerae anicteric ENT: MMM CV: Non-tachycardic Pulm: CTA b/l GI: Soft, NT/ND NEURO:  Alert & Oriented x 3   Gerrit Heck, DO [facility]   08/03/2022 1:58 PM

## 2022-08-03 NOTE — Op Note (Signed)
[facility] Patient Name: Amonie Wisser Procedure Date: 08/03/2022 2:30 PM MRN: [id number] Endoscopist: Doristine Locks , MD, [id number] Age: 42 Referring MD:  Date of Birth: 1981-04-07 Gender: Female Account #: [id number] Procedure:                Upper GI endoscopy w/ snare polypectomy and biopsy Indications:              Abdominal pain in the right upper quadrant,                            Abdominal bloating, Nausea Medicines:                Monitored Anesthesia Care Procedure:                Pre-Anesthesia Assessment:                           - Prior to the procedure, a History and Physical                            was performed, and patient medications and                            allergies were reviewed. The patient's tolerance of                            previous anesthesia was also reviewed. The risks                            and benefits of the procedure and the sedation                            options and risks were discussed with the patient.                            All questions were answered, and informed consent                            was obtained. Prior Anticoagulants: The patient has                            taken no anticoagulant or antiplatelet agents. ASA                            Grade Assessment: II - A patient with mild systemic                            disease. After reviewing the risks and benefits,                            the patient was deemed in satisfactory condition to                            undergo the procedure.  After obtaining informed consent, the endoscope was                            passed under direct vision. Throughout the                            procedure, the patient's blood pressure, pulse, and                            oxygen saturations were monitored continuously. The                            GIF [id number] #[id number] was introduced through the                             mouth, and advanced to the second part of duodenum.                            The upper GI endoscopy was accomplished without                            difficulty. The patient tolerated the procedure                            well. Scope In: Scope Out: Findings:                 The examined esophagus was normal.                           Multiple 3 to 8 mm sessile polyps with no bleeding                            were found in the gastric fundus and in the gastric                            body. >20 of these polyps were removed with a hot                            snare. Resection and retrieval were completed using                            hot snare the Roth net for retrieval. Estimated                            blood loss was minimal.                           The mucosa was otherwise normal appearing                            throughout the remainder of the stomach. Biopsies  were taken with a cold forceps for Helicobacter                            pylori testing. Estimated blood loss was minimal.                           The examined duodenum was normal. Biopsies were                            taken with a cold forceps for histology. Estimated                            blood loss was minimal. Complications:            No immediate complications. Estimated Blood Loss:     Estimated blood loss was minimal. Impression:               - Normal esophagus.                           - Multiple gastric polyps. >20 polyps were resected                            with hot snare and retrieved with Jabier Mutton net.                           - Normal mucosa was found in the entire stomach.                            Biopsied.                           - Normal examined duodenum. Biopsied. Recommendation:           - Patient has a contact number available for                            emergencies. The signs and symptoms of potential                             delayed complications were discussed with the                            patient. Return to normal activities tomorrow.                            Written discharge instructions were provided to the                            patient.                           - Resume previous diet.                           - Continue present medications.                           -  Await pathology results.                           - Proceed with CT as scheduled.                           - Return to GI clinic at appointment to be                            scheduled. Gerrit Heck, MD 08/03/2022 3:08:17 PM

## 2022-08-04 ENCOUNTER — Telehealth: Payer: Self-pay | Admitting: *Deleted

## 2022-08-04 NOTE — Telephone Encounter (Signed)
  Follow up Call-    Row Labels 08/03/2022    1:58 PM  Call back number   Section Header. No data exists in this row.   Post procedure Call Back phone  #   [phone]  Permission to leave phone message   Yes     Patient questions:  Do you have a fever, pain , or abdominal swelling? No. Pain Score  0 *  Have you tolerated food without any problems? Yes.    Have you been able to return to your normal activities? Yes.    Do you have any questions about your discharge instructions: Diet   No. Medications  No. Follow up visit  No.  Do you have questions or concerns about your Care? No.  Actions: * If pain score is 4 or above: No action needed, pain <4.

## 2022-08-09 ENCOUNTER — Ambulatory Visit (HOSPITAL_COMMUNITY)
Admission: RE | Admit: 2022-08-09 | Discharge: 2022-08-09 | Disposition: A | Discharge status: Home / Self Care | Payer: BC Managed Care – PPO | Source: Ambulatory Visit | Attending: Gastroenterology | Admitting: Gastroenterology

## 2022-08-09 DIAGNOSIS — R140 Abdominal distension (gaseous): Secondary | ICD-10-CM | POA: Insufficient documentation

## 2022-08-09 DIAGNOSIS — R1011 Right upper quadrant pain: Secondary | ICD-10-CM | POA: Diagnosis present

## 2022-08-09 DIAGNOSIS — R110 Nausea: Secondary | ICD-10-CM | POA: Diagnosis present

## 2022-08-09 MED ORDER — IOHEXOL 300 MG/ML  SOLN
100.0000 mL | Freq: Once | INTRAMUSCULAR | Status: AC | PRN
  Administered 2022-08-09: 100 mL via INTRAVENOUS

## 2022-08-11 ENCOUNTER — Other Ambulatory Visit: Payer: Self-pay

## 2022-08-11 DIAGNOSIS — N200 Calculus of kidney: Secondary | ICD-10-CM

## 2022-08-11 NOTE — Progress Notes (Signed)
Urology referral placed today to [facility] and faxed. Patient made aware. Informed her that she will be contacted by their office to schedule an appointment. Advised her to call them if she doesn't hear in 2 weeks. [facility] [address], [place], [place] [address] Phone: [phone]

## 2022-09-20 ENCOUNTER — Ambulatory Visit: Payer: BC Managed Care – PPO | Admitting: Gastroenterology

## 2022-09-20 ENCOUNTER — Encounter: Payer: Self-pay | Admitting: Gastroenterology

## 2022-09-20 VITALS — BP 110/78 | HR 93 | Ht 63.0 in | Wt 154.0 lb

## 2022-09-20 DIAGNOSIS — K8020 Calculus of gallbladder without cholecystitis without obstruction: Secondary | ICD-10-CM

## 2022-09-20 DIAGNOSIS — R110 Nausea: Secondary | ICD-10-CM

## 2022-09-20 DIAGNOSIS — R140 Abdominal distension (gaseous): Secondary | ICD-10-CM | POA: Diagnosis not present

## 2022-09-20 DIAGNOSIS — R1084 Generalized abdominal pain: Secondary | ICD-10-CM

## 2022-09-20 DIAGNOSIS — R194 Change in bowel habit: Secondary | ICD-10-CM

## 2022-09-20 MED ORDER — DICYCLOMINE HCL 10 MG PO CAPS: 10.0000 mg | ORAL_CAPSULE | Freq: Four times a day (QID) | ORAL | 3 refills | Status: DC | PRN

## 2022-09-20 MED ORDER — ONDANSETRON HCL 4 MG PO TABS: 4.0000 mg | ORAL_TABLET | Freq: Three times a day (TID) | ORAL | 3 refills | Status: DC | PRN

## 2022-09-20 NOTE — Patient Instructions (Signed)
You have been scheduled for a HIDA scan at [facility] (1st floor) on 10/12/22. Please arrive 30 minutes prior to your scheduled appointment at  9 AM. Make certain not to have anything to eat or drink at least 6 hours prior to your test. Should this appointment date or time not work well for you, please call radiology scheduling at [phone].  _____________________________________________________________________ hepatobiliary (HIDA) scan is an imaging procedure used to diagnose problems in the liver, gallbladder and bile ducts. In the HIDA scan, a radioactive chemical or tracer is injected into a vein in your arm. The tracer is handled by the liver like bile. Bile is a fluid produced and excreted by your liver that helps your digestive system break down fats in the foods you eat. Bile is stored in your gallbladder and the gallbladder releases the bile when you eat a meal. A special nuclear medicine scanner (gamma camera) tracks the flow of the tracer from your liver into your gallbladder and small intestine.  During your HIDA scan  You'll be asked to change into a hospital gown before your HIDA scan begins. Your health care team will position you on a table, usually on your back. The radioactive tracer is then injected into a vein in your arm.The tracer travels through your bloodstream to your liver, where it's taken up by the bile-producing cells. The radioactive tracer travels with the bile from your liver into your gallbladder and through your bile ducts to your small intestine.You may feel some pressure while the radioactive tracer is injected into your vein. As you lie on the table, a special gamma camera is positioned over your abdomen taking pictures of the tracer as it moves through your body. The gamma camera takes pictures continually for about an hour. You'll need to keep still during the HIDA scan. This can become uncomfortable, but you may find that you can lessen the discomfort  by taking deep breaths and thinking about other things. Tell your health care team if you're uncomfortable. The radiologist will watch on a computer the progress of the radioactive tracer through your body. The HIDA scan may be stopped when the radioactive tracer is seen in the gallbladder and enters your small intestine. This typically takes about an hour. In some cases extra imaging will be performed if original images aren't satisfactory, if morphine is given to help visualize the gallbladder or if the medication CCK is given to look at the contraction of the gallbladder. This test typically takes 2 hours to complete. ________________________________________________________________________   We have sent the following medications to your pharmacy for you to pick up at your convenience:  Zofran, Bentyl   _______________________________________________________  If your blood pressure at your visit was 140/90 or greater, please contact your primary care physician to follow up on this.  _______________________________________________________  If you are age 46 or younger, your body mass index should be between 19-25. Your Body mass index is 27.28 kg/m. If this is out of the aformentioned range listed, please consider follow up with your Primary Care Provider.   __________________________________________________________  The [place] [facility] providers would like to encourage you to use [facility] to communicate with providers for non-urgent requests or questions.  Due to long hold times on the telephone, sending your provider a message by [facility] may be a faster and more efficient way to get a response.  Please allow 48 business hours for a response.  Please remember that this is for non-urgent requests.   Thank  you for choosing me and [facility].  Vito Cirigliano, D.O.   Low-FODMAP Eating Plan  FODMAP stands for fermentable oligosaccharides, disaccharides, monosaccharides, and  polyols. These are sugars that are hard for some people to digest. A low-FODMAP eating plan may help some people who have irritable bowel syndrome (IBS) and certain other bowel (intestinal) diseases to manage their symptoms. This meal plan can be complicated to follow. Work with a diet and nutrition specialist (dietitian) to make a low-FODMAP eating plan that is right for you. A dietitian can help make sure that you get enough nutrition from this diet. What are tips for following this plan? Reading food labels Check labels for hidden FODMAPs such as: High-fructose syrup. Honey. Agave. Natural fruit flavors. Onion or garlic powder. Choose low-FODMAP foods that contain 3-4 grams of fiber per serving. Check food labels for serving sizes. Eat only one serving at a time to make sure FODMAP levels stay low. Shopping Shop with a list of foods that are recommended on this diet and make a meal plan. Meal planning Follow a low-FODMAP eating plan for up to 6 weeks, or as told by your health care provider or dietitian. To follow the eating plan: Eliminate high-FODMAP foods from your diet completely. Choose only low-FODMAP foods to eat. You will do this for 2-6 weeks. Gradually reintroduce high-FODMAP foods into your diet one at a time. Most people should wait a few days before introducing the next new high-FODMAP food into their meal plan. Your dietitian can recommend how quickly you may reintroduce foods. Keep a daily record of what and how much you eat and drink. Make note of any symptoms that you have after eating. Review your daily record with a dietitian regularly to identify which foods you can eat and which foods you should avoid. General tips Drink enough fluid each day to keep your urine pale yellow. Avoid processed foods. These often have added sugar and may be high in FODMAPs. Avoid most dairy products, whole grains, and sweeteners. Work with a dietitian to make sure you get enough fiber in  your diet. Avoid high FODMAP foods at meals to manage symptoms. Recommended foods Fruits Bananas, oranges, tangerines, lemons, limes, blueberries, raspberries, strawberries, grapes, cantaloupe, honeydew melon, kiwi, papaya, passion fruit, and pineapple. Limited amounts of dried cranberries, banana chips, and shredded coconut. Vegetables Eggplant, zucchini, cucumber, peppers, green beans, bean sprouts, lettuce, arugula, kale, Swiss chard, spinach, collard greens, bok choy, summer squash, potato, and tomato. Limited amounts of corn, carrot, and sweet potato. Green parts of scallions. Grains Gluten-free grains, such as rice, oats, buckwheat, quinoa, corn, polenta, and millet. Gluten-free pasta, bread, or cereal. Rice noodles. Corn tortillas. Meats and other proteins Unseasoned beef, pork, poultry, or fish. Eggs. Berniece Salines. Tofu (firm) and tempeh. Limited amounts of nuts and seeds, such as almonds, walnuts, [place] nuts, pecans, peanuts, nut butters, pumpkin seeds, chia seeds, and sunflower seeds. Dairy Lactose-free milk, yogurt, and kefir. Lactose-free cottage cheese and ice cream. Non-dairy milks, such as almond, coconut, hemp, and rice milk. Non-dairy yogurt. Limited amounts of goat cheese, brie, mozzarella, parmesan, swiss, and other hard cheeses. Fats and oils Butter-free spreads. Vegetable oils, such as olive, canola, and sunflower oil. Seasoning and other foods Artificial sweeteners with names that do not end in "ol," such as aspartame, saccharine, and stevia. Maple syrup, white table sugar, raw sugar, brown sugar, and molasses. Mayonnaise, soy sauce, and tamari. Fresh basil, coriander, parsley, rosemary, and thyme. Beverages Water and mineral water. Sugar-sweetened soft drinks. Small  amounts of orange juice or cranberry juice. Black and green tea. Most dry wines. Coffee. The items listed above may not be a complete list of foods and beverages you can eat. Contact a dietitian for more  information. Foods to avoid Fruits Fresh, dried, and juiced forms of apple, pear, watermelon, peach, plum, cherries, apricots, blackberries, boysenberries, figs, nectarines, and mango. Avocado. Vegetables Chicory root, artichoke, asparagus, cabbage, snow peas, Brussels sprouts, broccoli, sugar snap peas, mushrooms, celery, and cauliflower. Onions, garlic, leeks, and the white part of scallions. Grains Wheat, including kamut, durum, and semolina. Barley and bulgur. Couscous. Wheat-based cereals. Wheat noodles, bread, crackers, and pastries. Meats and other proteins Fried or fatty meat. Sausage. Cashews and pistachios. Soybeans, baked beans, black beans, chickpeas, kidney beans, fava beans, navy beans, lentils, black-eyed peas, and split peas. Dairy Milk, yogurt, ice cream, and soft cheese. Cream and sour cream. Milk-based sauces. Custard. Buttermilk. Soy milk. Seasoning and other foods Any sugar-free gum or candy. Foods that contain artificial sweeteners such as sorbitol, mannitol, isomalt, or xylitol. Foods that contain honey, high-fructose corn syrup, or agave. Bouillon, vegetable stock, beef stock, and chicken stock. Garlic and onion powder. Condiments made with onion, such as hummus, chutney, pickles, relish, salad dressing, and salsa. Tomato paste. Beverages Chicory-based drinks. Coffee substitutes. Chamomile tea. Fennel tea. Sweet or fortified wines such as port or sherry. Diet soft drinks made with isomalt, mannitol, maltitol, sorbitol, or xylitol. Apple, pear, and mango juice. Juices with high-fructose corn syrup. The items listed above may not be a complete list of foods and beverages you should avoid. Contact a dietitian for more information. Summary FODMAP stands for fermentable oligosaccharides, disaccharides, monosaccharides, and polyols. These are sugars that are hard for some people to digest. A low-FODMAP eating plan is a short-term diet that helps to ease symptoms of certain bowel  diseases. The eating plan usually lasts up to 6 weeks. After that, high-FODMAP foods are reintroduced gradually and one at a time. This can help you find out which foods may be causing symptoms. A low-FODMAP eating plan can be complicated. It is best to work with a dietitian who has experience with this type of plan. This information is not intended to replace advice given to you by your health care provider. Make sure you discuss any questions you have with your health care provider. Document Revised: 11/06/2019 Document Reviewed: 11/06/2019 Elsevier Patient Education  [place].

## 2022-09-20 NOTE — Progress Notes (Signed)
Chief Complaint:    Nausea, bloating, abdominal pain  GI History: 42 year old female who follows in the GI clinic for the following:  Has had "GI issues" for many years. Will have diarrhea x1 week with abdominal bloating and visible distension, nausea, then resolves. Has a few episodes/year.  Has not pinpointed any specific food triggers or other exacerbating features. - 07/2022: Normal CBC, CMP, TSH - 07/2022: Renal ultrasound: 8 mm right kidney stone, cholelithiasis - 08/02/2022: Initial appointment in the GI clinic.  Main issue was nausea, RUQ pain, bloating, superimposed on longstanding history of alternating bowel habits.  Placed referral to urology for nephrolithiasis and scheduled for CT and EGD - 08/03/2022: EGD: Normal esophagus, many fundic gland polyps (removed over 20), otherwise normal stomach, normal duodenum (gastric and duodenal path benign) - 08/09/2022: CT A/P: Normal GI tract and liver.  Cholelithiasis without cholecystitis.  5 mm right renal calculus.  If ongoing symptoms, recommended HIDA   HPI:     Patient is a 42 y.o. female presenting to the Gastroenterology Clinic for follow-up.  Initially seen by me on 08/02/2022 for evaluation of nausea, bloating, generalized abdominal pain.  Symptoms unchanged from prior and have been present for many years, but worse over the preceding few months.  Main issue today is continued nausea and postprandial bloating.  No emesis.  Nausea improves with Zofran on demand.  No lower GI symptoms.  Review of systems:     No chest pain, no SOB, no fevers, no urinary sx   Past Medical History:  Diagnosis Date   Anxiety    Arthritis    Gallstone    Hypothyroidism    Kidney stones    Obesity    Palpitations    PONV (postoperative nausea and vomiting)     Patient's surgical history, family medical history, social history, medications and allergies were all reviewed in Epic    Current Outpatient Medications  Medication Sig Dispense Refill    FLUoxetine (PROZAC) 20 MG capsule Take 20 mg by mouth daily.      levothyroxine (SYNTHROID, LEVOTHROID) 50 MCG tablet Take 50 mcg by mouth daily before breakfast.     acetaminophen-codeine (TYLENOL #3) 300-30 MG tablet Take 1-2 tablets by mouth every 4 (four) hours as needed (cough). (Patient not taking: Reported on 08/03/2022) 40 tablet 0   ALPRAZolam (XANAX) 0.25 MG tablet Take 0.25 mg by mouth daily as needed for anxiety. (Patient not taking: Reported on 09/20/2022)     Polyethyl Glycol-Propyl Glycol (SYSTANE ULTRA) 0.4-0.3 % SOLN Place 1 drop into both eyes daily. (Patient not taking: Reported on 09/20/2022)     No current facility-administered medications for this visit.    Physical Exam:     BP 110/78   Pulse 93   Ht 5\' 3"  (1.6 m)   Wt 154 lb (69.9 kg)   BMI 27.28 kg/m   GENERAL:  Pleasant female in NAD PSYCH: : Cooperative, normal affect NEURO: Alert and oriented x 3, no focal neurologic deficits   IMPRESSION and PLAN:    1) Nausea without emesis 2) Abdominal bloating 3) Upper abdominal pain 4) Cholelithiasis without cholecystitis - Trial low FODMAP diet.  Provided with handout and detailed instruction today - Refilled Zofran - Trial Bentyl 10 mg prn every 6 hours as needed - HIDA scan  5) Alternate in bowel habits - If recurrence of loose stool/diarrhea, plan for fecal fat, fecal pancreatic elastase +/- colonoscopy    RTC in 6 months or sooner prn  Lavena Bullion ,DO, FACG 09/20/2022, 3:19 PM

## 2022-09-28 ENCOUNTER — Other Ambulatory Visit: Payer: Self-pay | Admitting: Gastroenterology

## 2022-10-12 ENCOUNTER — Other Ambulatory Visit (HOSPITAL_COMMUNITY): Payer: BC Managed Care – PPO

## 2022-11-23 ENCOUNTER — Encounter (HOSPITAL_COMMUNITY)
Admission: RE | Admit: 2022-11-23 | Discharge: 2022-11-23 | Disposition: A | Discharge status: Home / Self Care | Payer: BC Managed Care – PPO | Source: Ambulatory Visit | Attending: Gastroenterology | Admitting: Gastroenterology

## 2022-11-23 DIAGNOSIS — R1084 Generalized abdominal pain: Secondary | ICD-10-CM | POA: Insufficient documentation

## 2022-11-23 DIAGNOSIS — R140 Abdominal distension (gaseous): Secondary | ICD-10-CM | POA: Diagnosis present

## 2022-11-23 DIAGNOSIS — R194 Change in bowel habit: Secondary | ICD-10-CM | POA: Diagnosis present

## 2022-11-23 DIAGNOSIS — R110 Nausea: Secondary | ICD-10-CM | POA: Diagnosis present

## 2022-11-23 DIAGNOSIS — K8020 Calculus of gallbladder without cholecystitis without obstruction: Secondary | ICD-10-CM | POA: Insufficient documentation

## 2022-11-23 MED ORDER — TECHNETIUM TC 99M MEBROFENIN IV KIT
4.9000 | PACK | Freq: Once | INTRAVENOUS | Status: AC | PRN
  Administered 2022-11-23: 4.9 via INTRAVENOUS

## 2023-09-01 HISTORY — PX: CHOLECYSTECTOMY: SHX55

## 2023-09-05 ENCOUNTER — Other Ambulatory Visit: Payer: Self-pay

## 2023-09-05 ENCOUNTER — Encounter (HOSPITAL_COMMUNITY): Payer: Self-pay

## 2023-09-05 ENCOUNTER — Observation Stay (HOSPITAL_COMMUNITY)
Admission: EM | Admit: 2023-09-05 | Discharge: 2023-09-07 | Disposition: A | Discharge status: Home / Self Care | Attending: General Surgery | Admitting: General Surgery

## 2023-09-05 ENCOUNTER — Emergency Department (HOSPITAL_COMMUNITY)

## 2023-09-05 DIAGNOSIS — K8020 Calculus of gallbladder without cholecystitis without obstruction: Principal | ICD-10-CM | POA: Diagnosis present

## 2023-09-05 DIAGNOSIS — E039 Hypothyroidism, unspecified: Secondary | ICD-10-CM | POA: Diagnosis not present

## 2023-09-05 DIAGNOSIS — R109 Unspecified abdominal pain: Secondary | ICD-10-CM | POA: Diagnosis present

## 2023-09-05 DIAGNOSIS — Z79899 Other long term (current) drug therapy: Secondary | ICD-10-CM | POA: Diagnosis not present

## 2023-09-05 DIAGNOSIS — K8012 Calculus of gallbladder with acute and chronic cholecystitis without obstruction: Principal | ICD-10-CM | POA: Insufficient documentation

## 2023-09-05 DIAGNOSIS — K812 Acute cholecystitis with chronic cholecystitis: Secondary | ICD-10-CM

## 2023-09-05 LAB — CBC WITH DIFFERENTIAL/PLATELET
Abs Immature Granulocytes: 0.01 10*3/uL (ref 0.00–0.07)
Basophils Absolute: 0 10*3/uL (ref 0.0–0.1)
Basophils Relative: 1 %
Eosinophils Absolute: 0.4 10*3/uL (ref 0.0–0.5)
Eosinophils Relative: 7 %
HCT: 38 % (ref 36.0–46.0)
Hemoglobin: 12.6 g/dL (ref 12.0–15.0)
Immature Granulocytes: 0 %
Lymphocytes Relative: 22 %
Lymphs Abs: 1.4 10*3/uL (ref 0.7–4.0)
MCH: 29.9 pg (ref 26.0–34.0)
MCHC: 33.2 g/dL (ref 30.0–36.0)
MCV: 90.3 fL (ref 80.0–100.0)
Monocytes Absolute: 0.5 10*3/uL (ref 0.1–1.0)
Monocytes Relative: 8 %
Neutro Abs: 3.9 10*3/uL (ref 1.7–7.7)
Neutrophils Relative %: 62 %
Platelets: 222 10*3/uL (ref 150–400)
RBC: 4.21 MIL/uL (ref 3.87–5.11)
RDW: 13.5 % (ref 11.5–15.5)
WBC: 6.3 10*3/uL (ref 4.0–10.5)
nRBC: 0 % (ref 0.0–0.2)

## 2023-09-05 LAB — COMPREHENSIVE METABOLIC PANEL
ALT: 13 U/L (ref 0–44)
AST: 14 U/L — ABNORMAL LOW (ref 15–41)
Albumin: 4 g/dL (ref 3.5–5.0)
Alkaline Phosphatase: 46 U/L (ref 38–126)
Anion gap: 9 (ref 5–15)
BUN: 11 mg/dL (ref 6–20)
CO2: 22 mmol/L (ref 22–32)
Calcium: 9.2 mg/dL (ref 8.9–10.3)
Chloride: 107 mmol/L (ref 98–111)
Creatinine, Ser: 0.9 mg/dL (ref 0.44–1.00)
GFR, Estimated: >60 mL/min (ref >60)
Glucose, Bld: 89 mg/dL (ref 70–99)
Potassium: 3.8 mmol/L (ref 3.5–5.1)
Sodium: 138 mmol/L (ref 135–145)
Total Bilirubin: 0.8 mg/dL (ref 0.0–1.2)
Total Protein: 7 g/dL (ref 6.5–8.1)

## 2023-09-05 LAB — LIPASE, BLOOD: Lipase: 33 U/L (ref 11–51)

## 2023-09-05 LAB — URINALYSIS, ROUTINE W REFLEX MICROSCOPIC
Bilirubin Urine: NEGATIVE
Glucose, UA: NEGATIVE mg/dL
Hgb urine dipstick: NEGATIVE
Ketones, ur: 20 mg/dL — AB
Leukocytes,Ua: NEGATIVE
Nitrite: NEGATIVE
Protein, ur: NEGATIVE mg/dL
Specific Gravity, Urine: 1.019 (ref 1.005–1.030)
pH: 5 (ref 5.0–8.0)

## 2023-09-05 LAB — HCG, SERUM, QUALITATIVE: Preg, Serum: NEGATIVE

## 2023-09-05 MED ORDER — SIMETHICONE 80 MG PO CHEW: 40.0000 mg | CHEWABLE_TABLET | Freq: Four times a day (QID) | ORAL | Status: DC | PRN

## 2023-09-05 MED ORDER — DIPHENHYDRAMINE HCL 50 MG/ML IJ SOLN
12.5000 mg | Freq: Four times a day (QID) | INTRAMUSCULAR | Status: DC | PRN
  Administered 2023-09-05: 12.5 mg via INTRAVENOUS
  Filled 2023-09-05: qty 1

## 2023-09-05 MED ORDER — DIPHENHYDRAMINE HCL 12.5 MG/5ML PO ELIX: 12.5000 mg | ORAL_SOLUTION | Freq: Four times a day (QID) | ORAL | Status: DC | PRN

## 2023-09-05 MED ORDER — PANTOPRAZOLE SODIUM 40 MG IV SOLR
40.0000 mg | Freq: Every day | INTRAVENOUS | Status: DC
  Administered 2023-09-05 – 2023-09-06 (×2): 40 mg via INTRAVENOUS
  Filled 2023-09-05 (×2): qty 10

## 2023-09-05 MED ORDER — ONDANSETRON HCL 4 MG/2ML IJ SOLN
4.0000 mg | Freq: Once | INTRAMUSCULAR | Status: AC
  Administered 2023-09-05: 4 mg via INTRAVENOUS
  Filled 2023-09-05: qty 2

## 2023-09-05 MED ORDER — LACTATED RINGERS IV SOLN: INTRAVENOUS | Status: AC

## 2023-09-05 MED ORDER — ONDANSETRON HCL 4 MG/2ML IJ SOLN
4.0000 mg | Freq: Four times a day (QID) | INTRAMUSCULAR | Status: DC | PRN
  Administered 2023-09-06 (×3): 4 mg via INTRAVENOUS
  Filled 2023-09-05 (×2): qty 2

## 2023-09-05 MED ORDER — ONDANSETRON 4 MG PO TBDP: 4.0000 mg | ORAL_TABLET | Freq: Four times a day (QID) | ORAL | Status: DC | PRN

## 2023-09-05 MED ORDER — HYDROMORPHONE HCL 1 MG/ML IJ SOLN
0.5000 mg | Freq: Once | INTRAMUSCULAR | Status: AC
  Administered 2023-09-05: 0.5 mg via INTRAVENOUS
  Filled 2023-09-05: qty 0.5

## 2023-09-05 MED ORDER — METOPROLOL TARTRATE 5 MG/5ML IV SOLN: 5.0000 mg | Freq: Four times a day (QID) | INTRAVENOUS | Status: DC | PRN

## 2023-09-05 MED ORDER — LOPERAMIDE HCL 2 MG PO CAPS
4.0000 mg | ORAL_CAPSULE | Freq: Once | ORAL | Status: AC
  Administered 2023-09-05: 4 mg via ORAL
  Filled 2023-09-05: qty 2

## 2023-09-05 MED ORDER — OXYCODONE HCL 5 MG PO TABS
5.0000 mg | ORAL_TABLET | ORAL | Status: DC | PRN
  Administered 2023-09-06: 10 mg via ORAL
  Administered 2023-09-06: 5 mg via ORAL
  Filled 2023-09-05 (×2): qty 2

## 2023-09-05 MED ORDER — HYDROMORPHONE HCL 1 MG/ML IJ SOLN
0.5000 mg | INTRAMUSCULAR | Status: DC | PRN
  Administered 2023-09-05 – 2023-09-07 (×5): 0.5 mg via INTRAVENOUS
  Filled 2023-09-05 (×5): qty 0.5

## 2023-09-05 MED ORDER — MORPHINE SULFATE (PF) 2 MG/ML IV SOLN: 2.0000 mg | INTRAVENOUS | Status: DC | PRN

## 2023-09-05 MED ORDER — DOCUSATE SODIUM 100 MG PO CAPS
100.0000 mg | ORAL_CAPSULE | Freq: Two times a day (BID) | ORAL | Status: DC
  Filled 2023-09-05: qty 1

## 2023-09-05 MED ORDER — ORAL CARE MOUTH RINSE: 15.0000 mL | OROMUCOSAL | Status: DC | PRN

## 2023-09-05 NOTE — ED Notes (Signed)
 Pt lying in bed with family at bedside. Denies pain at this time stated previous medicine given was helpful. Denies N/V at this time

## 2023-09-05 NOTE — ED Provider Notes (Signed)
  EMERGENCY DEPARTMENT AT [facility] Provider Note   CSN: [id number] Arrival date & time: 09/05/23  1478     History  Chief Complaint  Patient presents with   Flank Pain    Kathy Martin is a 43 y.o. female.  HPI 43 year old female presents with concern for a gallbladder problem.  She states that she has been having on and off abdominal pain, diarrhea, nausea for months.  This has been attributed to her gallbladder.  Pain is primarily upper abdomen and right sided.  She also gets diarrhea and recently is having yellow mucousy stools as well as significant nausea without vomiting.  No fevers.  Her low back is also hurting.  Denies any urinary symptoms.  Most recent episode has been ongoing for a couple of days.  Home Medications Prior to Admission medications   Medication Sig Start Date End Date Taking? Authorizing Provider  acetaminophen-codeine (TYLENOL #3) 300-30 MG tablet Take 1-2 tablets by mouth every 4 (four) hours as needed (cough). Patient not taking: Reported on 08/03/2022 12/31/18   Nyoka Cowden, MD  ALPRAZolam Prudy Feeler) 0.25 MG tablet Take 0.25 mg by mouth daily as needed for anxiety. Patient not taking: Reported on 09/20/2022    [provider]  dicyclomine (BENTYL) 10 MG capsule Take 1 capsule (10 mg total) by mouth every 6 (six) hours as needed for spasms. 09/20/22   Cirigliano, Vito V, DO  FLUoxetine (PROZAC) 20 MG capsule Take 20 mg by mouth daily.  10/04/17   [provider]  levothyroxine (SYNTHROID, LEVOTHROID) 50 MCG tablet Take 50 mcg by mouth daily before breakfast.    [provider]  ondansetron (ZOFRAN) 4 MG tablet Take 1 tablet (4 mg total) by mouth every 8 (eight) hours as needed for nausea or vomiting. 09/20/22   Cirigliano, Vito V, DO  Polyethyl Glycol-Propyl Glycol (SYSTANE ULTRA) 0.4-0.3 % SOLN Place 1 drop into both eyes daily. Patient not taking: Reported on 09/20/2022    [provider]       Allergies    Patient has no known allergies.    Review of Systems   Review of Systems  Gastrointestinal:  Positive for abdominal pain, diarrhea and nausea. Negative for blood in stool and vomiting.    Physical Exam Updated Vital Signs BP 101/67 (BP Location: Right Arm)   Pulse 91   Temp (!) 97.5 F (36.4 C) (Oral)   Resp 17   Ht 5\' 3"  (1.6 m)   Wt 69.5 kg   LMP 08/14/2023 (Approximate)   SpO2 99%   BMI 27.14 kg/m  Physical Exam Vitals and nursing note reviewed.  Constitutional:      Appearance: She is well-developed.  HENT:     Head: Normocephalic and atraumatic.  Cardiovascular:     Rate and Rhythm: Normal rate and regular rhythm.     Heart sounds: Normal heart sounds.  Pulmonary:     Effort: Pulmonary effort is normal.     Breath sounds: Normal breath sounds.  Abdominal:     Palpations: Abdomen is soft.     Tenderness: There is abdominal tenderness in the right upper quadrant and right lower quadrant. There is no right CVA tenderness or left CVA tenderness.     Comments: RUQ worse than RLQ  Skin:    General: Skin is warm and dry.  Neurological:     Mental Status: She is alert.     ED Results / Procedures / Treatments   Labs (all  labs ordered are listed, but only abnormal results are displayed) Labs Reviewed  COMPREHENSIVE METABOLIC PANEL - Abnormal; Notable for the following components:      Result Value   AST 14 (*)    All other components within normal limits  URINALYSIS, ROUTINE W REFLEX MICROSCOPIC - Abnormal; Notable for the following components:   Ketones, ur 20 (*)    All other components within normal limits  CBC WITH DIFFERENTIAL/PLATELET  LIPASE, BLOOD  HCG, SERUM, QUALITATIVE  HIV ANTIBODY (ROUTINE TESTING W REFLEX)    EKG None  Radiology US Abdomen Limited RUQ (LIVER/GB) Result Date: 09/05/2023 CLINICAL DATA:  Cholelithiasis EXAM: ULTRASOUND ABDOMEN LIMITED RIGHT UPPER QUADRANT COMPARISON:  HIDA scan 11/23/2022.  CT 08/09/2022  FINDINGS: Gallbladder: Distended gallbladder with a large stone measuring up to 2.4 cm. No wall thickening or adjacent fluid. No reported sonographic Murphy's sign. Common bile duct: Diameter: 7 mm.  Similar in caliber to the prior CT. Liver: No focal lesion identified. Within normal limits in parenchymal echogenicity. Portal vein is patent on color Doppler imaging with normal direction of blood flow towards the liver. Other: None. IMPRESSION: Distended gallbladder with a large stone. No further sonographic evidence of acute cholecystitis. The common duct is borderline dilated up to 7 mm but is similar in size to the prior CT scan from 08/09/2022 and measured in the same fashion. Electronically Signed   By: Karen Kays M.D.   On: 09/05/2023 13:57    Procedures Procedures    Medications Ordered in ED Medications  oxyCODONE (Oxy IR/ROXICODONE) immediate release tablet 5-10 mg (has no administration in time range)  morphine (PF) 2 MG/ML injection 2 mg (has no administration in time range)  diphenhydrAMINE (BENADRYL) 12.5 MG/5ML elixir 12.5 mg (has no administration in time range)    Or  diphenhydrAMINE (BENADRYL) injection 12.5 mg (has no administration in time range)  docusate sodium (COLACE) capsule 100 mg (has no administration in time range)  ondansetron (ZOFRAN-ODT) disintegrating tablet 4 mg (has no administration in time range)    Or  ondansetron (ZOFRAN) injection 4 mg (has no administration in time range)  simethicone (MYLICON) chewable tablet 40 mg (has no administration in time range)  pantoprazole (PROTONIX) injection 40 mg (has no administration in time range)  metoprolol tartrate (LOPRESSOR) injection 5 mg (has no administration in time range)  HYDROmorphone (DILAUDID) injection 0.5 mg (0.5 mg Intravenous Given 09/05/23 1059)  ondansetron (ZOFRAN) injection 4 mg (4 mg Intravenous Given 09/05/23 1057)    ED Course/ Medical Decision Making/ A&P                                  Medical Decision Making Amount and/or Complexity of Data Reviewed External Data Reviewed: notes. Labs: ordered.    Details: Normal WBC and LFTs Radiology: ordered and independent interpretation performed.    Details: Cholelithiasis  Risk Prescription drug management. Decision regarding hospitalization.   Patient seemed to be having recurrent pain from her cholelithiasis.  I discussed we could consider CT given some right lower quadrant component but she says most of the pain is in her right upper quadrant and this has been an on-and-off problem for quite some time.  I do think appendicitis would be a lot less likely.  Ultrasound shows there is a very large stone but no cholecystitis.  Discussed with Dr. Henreitta Leber, given how her pain is poorly controlled she will admit for OR tomorrow.  Final Clinical Impression(s) / ED Diagnoses Final diagnoses:  Calculus of gallbladder without cholecystitis without obstruction    Rx / DC Orders ED Discharge Orders     None         Pricilla Loveless, MD 09/05/23 1600

## 2023-09-05 NOTE — Progress Notes (Signed)
 Upon this RN's shift assessment she is alert and oriented x 4. She c/o RUQ abdominal tenderness, multiple episodes of watery diarrhea today ( no obvious blood reported), dry mouth, and nausea that has been relieved by Zofran. LR running at 28ml/hr with out complications. She reports when she urinates she only trickles and feels she is dehydrated. Kellogg RN

## 2023-09-05 NOTE — ED Triage Notes (Signed)
 Pt arrived via POV from home c/o recurrent right flank pain that radiates to her back. Pt reports last taking 4mg  Zofran at 0200 PTA. Pt reports Hx of gallstones and is requesting to speak to a doctor regarding a cholecystectomy.

## 2023-09-05 NOTE — H&P (Signed)
 [facility] History and Physical  Reason for Referral: Gallstones, intractable pain and nausea  Referring Physician: Dr. Criss Alvine  Chief Complaint   Flank Pain     Kathy Martin is a 43 y.o. female.  HPI: Kathy Martin is a 43 yo who has been having pain in the RUQ and associated nausea/ diarrhea for over 1 year now. She has a prior history of obesity and reports that she has been on Tirzepatide for 2 years and then transitioned over to injectable semaglutide that she takes 1/4 dose for maintenance. She has lost 60 Lbs. She has been having worsening episodes and pain since last year. She had last year been seen by GI and Dr. Barron Alvine and had an EGD and further workup. She had a CT with a stone, HIDA that was negative and EGD with some gastric polyps but nothing major. She say that the episodes come irregularly and she was on a work trip this week and had this episode. She has been having diarrhea too. She works as a [employer]arry manager.   Past Medical History:  Diagnosis Date   Anxiety    Arthritis    Gallstone    Hypothyroidism    Kidney stones    Obesity    Palpitations    PONV (postoperative nausea and vomiting)     Past Surgical History:  Procedure Laterality Date   ANTERIOR CERVICAL DECOMP/DISCECTOMY FUSION N/A 06/07/2018   Procedure: C5-6, C-6-7 ANTERIOR CERVICAL DECOMPRESSION/DISCECTOMY FUSION, ALLOGRAFT, PLATE;  Surgeon: Eldred Manges, MD;  Location: [facility] OR;  Service: Orthopedics;  Laterality: N/A;   BREAST REDUCTION SURGERY  03/2013   CESAREAN SECTION  2011, 2013   x2   DILATION AND CURETTAGE OF UTERUS     GANGLION CYST EXCISION     Right   mucinous tumor  2010   OOPHORECTOMY  2010   rt    Family History  Problem Relation Age of Onset   Barrett's esophagus Maternal Grandmother    Colon cancer Neg Hx    Esophageal cancer Neg Hx     Social History   Tobacco Use   Smoking status: Never   Smokeless tobacco: Never  Vaping Use   Vaping status:  Never Used  Substance Use Topics   Alcohol use: Not Currently   Drug use: Never    Medications: I have reviewed the patient's current medications. Prior to Admission:  Medications Prior to Admission  Medication Sig Dispense Refill Last Dose/Taking   acetaminophen-codeine (TYLENOL #3) 300-30 MG tablet Take 1-2 tablets by mouth every 4 (four) hours as needed (cough). (Patient not taking: Reported on 08/03/2022) 40 tablet 0    ALPRAZolam (XANAX) 0.25 MG tablet Take 0.25 mg by mouth daily as needed for anxiety. (Patient not taking: Reported on 09/20/2022)      dicyclomine (BENTYL) 10 MG capsule Take 1 capsule (10 mg total) by mouth every 6 (six) hours as needed for spasms. 60 capsule 3    FLUoxetine (PROZAC) 20 MG capsule Take 20 mg by mouth daily.       levothyroxine (SYNTHROID, LEVOTHROID) 50 MCG tablet Take 50 mcg by mouth daily before breakfast.      ondansetron (ZOFRAN) 4 MG tablet Take 1 tablet (4 mg total) by mouth every 8 (eight) hours as needed for nausea or vomiting. 60 tablet 3    Polyethyl Glycol-Propyl Glycol (SYSTANE ULTRA) 0.4-0.3 % SOLN Place 1 drop into both eyes daily. (Patient not taking: Reported on 09/20/2022)  Scheduled:  docusate sodium  100 mg Oral BID   pantoprazole (PROTONIX) IV  40 mg Intravenous QHS   Continuous:  lactated ringers     ZOX:[id number] **OR** diphenhydrAMINE, HYDROmorphone (DILAUDID) injection, metoprolol tartrate, ondansetron **OR** ondansetron (ZOFRAN) IV, mouth rinse, oxyCODONE, simethicone  No Known Allergies    ROS:  A comprehensive review of systems was negative except for: Gastrointestinal: positive for abdominal pain, diarrhea, and nausea  Blood pressure 101/74, pulse 89, temperature 97.7 F (36.5 C), temperature source Oral, resp. rate 16, height 5\' 3"  (1.6 m), weight 69.5 kg, last menstrual period 08/14/2023, SpO2 99%. Physical Exam Vitals reviewed.  HENT:     Head: Normocephalic.     Nose: Nose normal.  Eyes:      Extraocular Movements: Extraocular movements intact.  Cardiovascular:     Rate and Rhythm: Normal rate.  Pulmonary:     Effort: Pulmonary effort is normal.  Abdominal:     General: There is no distension.     Palpations: Abdomen is soft.     Tenderness: There is abdominal tenderness in the right upper quadrant.  Musculoskeletal:        General: Normal range of motion.  Skin:    General: Skin is warm.  Neurological:     General: No focal deficit present.     Mental Status: She is alert and oriented to person, place, and time.  Psychiatric:        Mood and Affect: Mood normal.        Behavior: Behavior normal.        Thought Content: Thought content normal.        Judgment: Judgment normal.     Results: Results for orders placed or performed during the hospital encounter of 09/05/23 (from the past 48 hours)  Comprehensive metabolic panel     Status: Abnormal   Collection Time: 09/05/23  9:46 AM  Result Value Ref Range   Sodium 138 135 - 145 mmol/L   Potassium 3.8 3.5 - 5.1 mmol/L   Chloride 107 98 - 111 mmol/L   CO2 22 22 - 32 mmol/L   Glucose, Bld 89 70 - 99 mg/dL    Comment: Glucose reference range applies only to samples taken after fasting for at least 8 hours.   BUN 11 6 - 20 mg/dL   Creatinine, Ser 7.82 0.44 - 1.00 mg/dL   Calcium 9.2 8.9 - 95.6 mg/dL   Total Protein 7.0 6.5 - 8.1 g/dL   Albumin 4.0 3.5 - 5.0 g/dL   AST 14 (L) 15 - 41 U/L   ALT 13 0 - 44 U/L   Alkaline Phosphatase 46 38 - 126 U/L   Total Bilirubin 0.8 0.0 - 1.2 mg/dL   GFR, Estimated >21 >30 mL/min    Comment: (NOTE) Calculated using the CKD-EPI Creatinine Equation (2021)    Anion gap 9 5 - 15    Comment: Performed at [facility], [address], [place], [place] [address]  CBC with Differential     Status: None   Collection Time: 09/05/23  9:46 AM  Result Value Ref Range   WBC 6.3 4.0 - 10.5 K/uL   RBC 4.21 3.87 - 5.11 MIL/uL   Hemoglobin 12.6 12.0 - 15.0 g/dL   HCT 46.9 62.9 - 52.8 %    MCV 90.3 80.0 - 100.0 fL   MCH 29.9 26.0 - 34.0 pg   MCHC 33.2 30.0 - 36.0 g/dL   RDW 41.3 24.4 - 01.0 %  Platelets 222 150 - 400 K/uL   nRBC 0.0 0.0 - 0.2 %   Neutrophils Relative % 62 %   Neutro Abs 3.9 1.7 - 7.7 K/uL   Lymphocytes Relative 22 %   Lymphs Abs 1.4 0.7 - 4.0 K/uL   Monocytes Relative 8 %   Monocytes Absolute 0.5 0.1 - 1.0 K/uL   Eosinophils Relative 7 %   Eosinophils Absolute 0.4 0.0 - 0.5 K/uL   Basophils Relative 1 %   Basophils Absolute 0.0 0.0 - 0.1 K/uL   Immature Granulocytes 0 %   Abs Immature Granulocytes 0.01 0.00 - 0.07 K/uL    Comment: Performed at [facility], [address] [place], [place] [address]  Lipase, blood     Status: None   Collection Time: 09/05/23  9:46 AM  Result Value Ref Range   Lipase 33 11 - 51 U/L    Comment: Performed at [facility], [address] [place], [place] [address]  hCG, serum, qualitative     Status: None   Collection Time: 09/05/23  9:51 AM  Result Value Ref Range   Preg, Serum NEGATIVE NEGATIVE    Comment:        THE SENSITIVITY OF THIS METHODOLOGY IS >10 mIU/mL. Performed at [facility], [address], [place], [place] [address]   Urinalysis, Routine w reflex microscopic -Urine, Clean Catch     Status: Abnormal   Collection Time: 09/05/23  2:10 PM  Result Value Ref Range   Color, Urine YELLOW YELLOW   APPearance CLEAR CLEAR   Specific Gravity, Urine 1.019 1.005 - 1.030   pH 5.0 5.0 - 8.0   Glucose, UA NEGATIVE NEGATIVE mg/dL   Hgb urine dipstick NEGATIVE NEGATIVE   Bilirubin Urine NEGATIVE NEGATIVE   Ketones, ur 20 (A) NEGATIVE mg/dL   Protein, ur NEGATIVE NEGATIVE mg/dL   Nitrite NEGATIVE NEGATIVE   Leukocytes,Ua NEGATIVE NEGATIVE    Comment: Performed at [facility], [address], [place], [place] [address]   Personally reviewed and reviewed CT and showed patient- stone large in the gallbladder ,LFTs wnl  US Abdomen Limited RUQ (LIVER/GB) Result Date: 09/05/2023 CLINICAL DATA:  Cholelithiasis EXAM:  ULTRASOUND ABDOMEN LIMITED RIGHT UPPER QUADRANT COMPARISON:  HIDA scan 11/23/2022.  CT 08/09/2022 FINDINGS: Gallbladder: Distended gallbladder with a large stone measuring up to 2.4 cm. No wall thickening or adjacent fluid. No reported sonographic Murphy's sign. Common bile duct: Diameter: 7 mm.  Similar in caliber to the prior CT. Liver: No focal lesion identified. Within normal limits in parenchymal echogenicity. Portal vein is patent on color Doppler imaging with normal direction of blood flow towards the liver. Other: None. IMPRESSION: Distended gallbladder with a large stone. No further sonographic evidence of acute cholecystitis. The common duct is borderline dilated up to 7 mm but is similar in size to the prior CT scan from 08/09/2022 and measured in the same fashion. Electronically Signed   By: Karen Kays M.D.   On: 09/05/2023 13:57     Assessment & Plan:  ANDRIAN URBACH is a 43 y.o. female with a large stone in the gallbladder and symptoms that fit partly with biliary symptoms. The diarrhea could be from this but it also could be from something like IBS. Discussed gallstones and symptoms.   PLAN: I counseled the patient about the indication, risks and benefits of robotic assisted laparoscopic cholecystectomy.  She understands there is a very small chance for bleeding, infection, injury to normal structures (including common bile duct), conversion  to open surgery, persistent symptoms, evolution of postcholecystectomy diarrhea, need for secondary interventions, anesthesia reaction, cardiopulmonary issues and other risks not specifically detailed here. I described the expected recovery, the plan for follow-up and the restrictions during the recovery phase.  All questions were answered.  Discussed if improvement in the diarrhea she may need to see GI regarding IBS. Discussed semaglutide use and risk of aspiration and fact we are leaving her NPO longer than the standard 6 hrs.   Discussed her work  and likely need for light duty for a few weeks. Potentially return to work in 1 week and light duty for another 3 weeks. Going back to regular work on 10/04/23.   All questions were answered to the satisfaction of the patient. NPO now PRN for pain LR @ 40   Lucretia Roers 09/05/2023, 5:06 PM

## 2023-09-05 NOTE — ED Notes (Signed)
 This nurse went  to get urine sample from pt. Pt stated, "I went to the bathroom and forgot to grab cup off of counter."

## 2023-09-06 ENCOUNTER — Other Ambulatory Visit: Payer: Self-pay

## 2023-09-06 ENCOUNTER — Observation Stay (HOSPITAL_COMMUNITY): Admitting: Anesthesiology

## 2023-09-06 ENCOUNTER — Encounter (HOSPITAL_COMMUNITY)
Admission: EM | Disposition: A | Discharge status: Home / Self Care | Payer: Self-pay | Source: Home / Self Care | Attending: Emergency Medicine

## 2023-09-06 DIAGNOSIS — K8020 Calculus of gallbladder without cholecystitis without obstruction: Secondary | ICD-10-CM | POA: Diagnosis not present

## 2023-09-06 DIAGNOSIS — K812 Acute cholecystitis with chronic cholecystitis: Secondary | ICD-10-CM

## 2023-09-06 LAB — CBC
HCT: 34.4 % — ABNORMAL LOW (ref 36.0–46.0)
Hemoglobin: 11.3 g/dL — ABNORMAL LOW (ref 12.0–15.0)
MCH: 30.1 pg (ref 26.0–34.0)
MCHC: 32.8 g/dL (ref 30.0–36.0)
MCV: 91.7 fL (ref 80.0–100.0)
Platelets: 177 10*3/uL (ref 150–400)
RBC: 3.75 MIL/uL — ABNORMAL LOW (ref 3.87–5.11)
RDW: 13.2 % (ref 11.5–15.5)
WBC: 5.3 10*3/uL (ref 4.0–10.5)
nRBC: 0 % (ref 0.0–0.2)

## 2023-09-06 LAB — COMPREHENSIVE METABOLIC PANEL
ALT: 11 U/L (ref 0–44)
AST: 13 U/L — ABNORMAL LOW (ref 15–41)
Albumin: 3.6 g/dL (ref 3.5–5.0)
Alkaline Phosphatase: 38 U/L (ref 38–126)
Anion gap: 7 (ref 5–15)
BUN: 14 mg/dL (ref 6–20)
CO2: 18 mmol/L — ABNORMAL LOW (ref 22–32)
Calcium: 8.4 mg/dL — ABNORMAL LOW (ref 8.9–10.3)
Chloride: 110 mmol/L (ref 98–111)
Creatinine, Ser: 0.76 mg/dL (ref 0.44–1.00)
GFR, Estimated: >60 mL/min (ref >60)
Glucose, Bld: 58 mg/dL — ABNORMAL LOW (ref 70–99)
Potassium: 3.7 mmol/L (ref 3.5–5.1)
Sodium: 135 mmol/L (ref 135–145)
Total Bilirubin: 1.3 mg/dL — ABNORMAL HIGH (ref 0.0–1.2)
Total Protein: 5.8 g/dL — ABNORMAL LOW (ref 6.5–8.1)

## 2023-09-06 LAB — GLUCOSE, CAPILLARY
Glucose-Capillary: 52 mg/dL — ABNORMAL LOW (ref 70–99)
Glucose-Capillary: 169 mg/dL — ABNORMAL HIGH (ref 70–99)
Glucose-Capillary: 127 mg/dL — ABNORMAL HIGH (ref 70–99)

## 2023-09-06 LAB — HIV ANTIBODY (ROUTINE TESTING W REFLEX): HIV Screen 4th Generation wRfx: NONREACTIVE

## 2023-09-06 SURGERY — CHOLECYSTECTOMY, ROBOT-ASSISTED, LAPAROSCOPIC
Anesthesia: General | Site: Abdomen

## 2023-09-06 MED ORDER — FENTANYL CITRATE PF 50 MCG/ML IJ SOSY
25.0000 ug | PREFILLED_SYRINGE | INTRAMUSCULAR | Status: DC | PRN
  Administered 2023-09-06 (×2): 50 ug via INTRAVENOUS
  Administered 2023-09-06 (×2): 25 ug via INTRAVENOUS
  Filled 2023-09-06 (×3): qty 1

## 2023-09-06 MED ORDER — PROPOFOL 500 MG/50ML IV EMUL
INTRAVENOUS | Status: DC | PRN
  Administered 2023-09-06: 100 ug/kg/min via INTRAVENOUS

## 2023-09-06 MED ORDER — MIDAZOLAM HCL 2 MG/2ML IJ SOLN
INTRAMUSCULAR | Status: DC | PRN
Start: 2023-09-06 — End: 2023-09-06
  Administered 2023-09-06: 2 mg via INTRAVENOUS

## 2023-09-06 MED ORDER — MIDAZOLAM HCL 2 MG/2ML IJ SOLN
INTRAMUSCULAR | Status: AC
  Filled 2023-09-06: qty 2

## 2023-09-06 MED ORDER — DEXAMETHASONE SODIUM PHOSPHATE 10 MG/ML IJ SOLN
INTRAMUSCULAR | Status: AC
  Filled 2023-09-06: qty 1

## 2023-09-06 MED ORDER — OXYCODONE HCL 5 MG PO TABS: 5.0000 mg | ORAL_TABLET | Freq: Once | ORAL | Status: AC | PRN

## 2023-09-06 MED ORDER — LEVOTHYROXINE SODIUM 100 MCG PO TABS
50.0000 ug | ORAL_TABLET | Freq: Every day | ORAL | Status: DC
  Administered 2023-09-07: 50 ug via ORAL
  Filled 2023-09-06: qty 1

## 2023-09-06 MED ORDER — PROPOFOL 10 MG/ML IV BOLUS
INTRAVENOUS | Status: DC | PRN
  Administered 2023-09-06: 150 mg via INTRAVENOUS

## 2023-09-06 MED ORDER — LIDOCAINE HCL (CARDIAC) PF 100 MG/5ML IV SOSY
PREFILLED_SYRINGE | INTRAVENOUS | Status: DC | PRN
  Administered 2023-09-06: 50 mg via INTRAVENOUS

## 2023-09-06 MED ORDER — FLUOXETINE HCL 10 MG PO CAPS
10.0000 mg | ORAL_CAPSULE | Freq: Every day | ORAL | Status: DC
  Administered 2023-09-06: 10 mg via ORAL
  Filled 2023-09-06 (×2): qty 1

## 2023-09-06 MED ORDER — DEXMEDETOMIDINE HCL IN NACL 80 MCG/20ML IV SOLN
INTRAVENOUS | Status: DC | PRN
  Administered 2023-09-06: 20 ug via INTRAVENOUS

## 2023-09-06 MED ORDER — BUPIVACAINE HCL (PF) 0.5 % IJ SOLN
INTRAMUSCULAR | Status: DC | PRN
  Administered 2023-09-06: 30 mL

## 2023-09-06 MED ORDER — SCOPOLAMINE 1 MG/3DAYS TD PT72
1.0000 | MEDICATED_PATCH | Freq: Once | TRANSDERMAL | Status: DC
  Administered 2023-09-06: 1.5 mg via TRANSDERMAL

## 2023-09-06 MED ORDER — PROPOFOL 10 MG/ML IV BOLUS
INTRAVENOUS | Status: AC
  Filled 2023-09-06: qty 20

## 2023-09-06 MED ORDER — ONDANSETRON HCL 4 MG/2ML IJ SOLN: 4.0000 mg | Freq: Once | INTRAMUSCULAR | Status: DC | PRN

## 2023-09-06 MED ORDER — BUPIVACAINE HCL (PF) 0.5 % IJ SOLN
INTRAMUSCULAR | Status: AC
  Filled 2023-09-06: qty 30

## 2023-09-06 MED ORDER — SCOPOLAMINE 1 MG/3DAYS TD PT72
MEDICATED_PATCH | TRANSDERMAL | Status: AC
Start: 2023-09-06 — End: 2023-09-06
  Filled 2023-09-06: qty 1

## 2023-09-06 MED ORDER — CHLORHEXIDINE GLUCONATE 0.12 % MT SOLN
OROMUCOSAL | Status: AC
Start: 2023-09-06 — End: 2023-09-06
  Filled 2023-09-06: qty 15

## 2023-09-06 MED ORDER — SUGAMMADEX SODIUM 200 MG/2ML IV SOLN
INTRAVENOUS | Status: DC | PRN
Start: 2023-09-06 — End: 2023-09-06
  Administered 2023-09-06: 200 mg via INTRAVENOUS

## 2023-09-06 MED ORDER — FENTANYL CITRATE (PF) 250 MCG/5ML IJ SOLN
INTRAMUSCULAR | Status: AC
  Filled 2023-09-06: qty 5

## 2023-09-06 MED ORDER — TRAZODONE HCL 50 MG PO TABS
25.0000 mg | ORAL_TABLET | Freq: Every day | ORAL | Status: DC
  Administered 2023-09-06: 50 mg via ORAL
  Filled 2023-09-06: qty 1

## 2023-09-06 MED ORDER — SODIUM CHLORIDE 0.9 % IV SOLN: 2.0000 g | INTRAVENOUS | Status: DC

## 2023-09-06 MED ORDER — FENTANYL CITRATE (PF) 100 MCG/2ML IJ SOLN
INTRAMUSCULAR | Status: DC | PRN
  Administered 2023-09-06: 100 ug via INTRAVENOUS

## 2023-09-06 MED ORDER — ROCURONIUM BROMIDE 10 MG/ML (PF) SYRINGE
PREFILLED_SYRINGE | INTRAVENOUS | Status: DC | PRN
  Administered 2023-09-06: 60 mg via INTRAVENOUS
  Administered 2023-09-06: 20 mg via INTRAVENOUS

## 2023-09-06 MED ORDER — LIDOCAINE HCL (PF) 2 % IJ SOLN
INTRAMUSCULAR | Status: AC
  Filled 2023-09-06: qty 5

## 2023-09-06 MED ORDER — SODIUM CHLORIDE 0.9 % IV SOLN
INTRAVENOUS | Status: AC
  Filled 2023-09-06: qty 2

## 2023-09-06 MED ORDER — ROCURONIUM BROMIDE 10 MG/ML (PF) SYRINGE
PREFILLED_SYRINGE | INTRAVENOUS | Status: AC
  Filled 2023-09-06: qty 10

## 2023-09-06 MED ORDER — INDOCYANINE GREEN 25 MG IV SOLR
INTRAVENOUS | Status: AC
  Filled 2023-09-06: qty 10

## 2023-09-06 MED ORDER — INDOCYANINE GREEN 25 MG IV SOLR
2.5000 mg | Freq: Once | INTRAVENOUS | Status: AC
  Administered 2023-09-06: 2.5 mg via INTRAVENOUS

## 2023-09-06 MED ORDER — SODIUM CHLORIDE 0.9 % IV SOLN
INTRAVENOUS | Status: DC | PRN
  Administered 2023-09-06: 2 g via INTRAVENOUS

## 2023-09-06 MED ORDER — DEXAMETHASONE SODIUM PHOSPHATE 10 MG/ML IJ SOLN
INTRAMUSCULAR | Status: DC | PRN
  Administered 2023-09-06: 10 mg via INTRAVENOUS

## 2023-09-06 MED ORDER — POLYVINYL ALCOHOL 1.4 % OP SOLN: 1.0000 [drp] | OPHTHALMIC | Status: DC | PRN

## 2023-09-06 MED ORDER — ONDANSETRON 4 MG PO TBDP: 4.0000 mg | ORAL_TABLET | Freq: Four times a day (QID) | ORAL | 0 refills | Status: AC | PRN

## 2023-09-06 MED ORDER — OXYCODONE HCL 5 MG PO TABS: 5.0000 mg | ORAL_TABLET | ORAL | 0 refills | Status: DC | PRN

## 2023-09-06 MED ORDER — STERILE WATER FOR IRRIGATION IR SOLN
Status: DC | PRN
  Administered 2023-09-06: 500 mL

## 2023-09-06 MED ORDER — ONDANSETRON HCL 4 MG/2ML IJ SOLN
INTRAMUSCULAR | Status: AC
  Filled 2023-09-06: qty 2

## 2023-09-06 MED ORDER — DEXTROSE 50 % IV SOLN
INTRAVENOUS | Status: AC
  Filled 2023-09-06: qty 50

## 2023-09-06 MED ORDER — DEXTROSE 50 % IV SOLN
50.0000 mL | Freq: Once | INTRAVENOUS | Status: AC
  Administered 2023-09-06: 50 mL via INTRAVENOUS

## 2023-09-06 MED ORDER — OXYCODONE HCL 5 MG/5ML PO SOLN
5.0000 mg | Freq: Once | ORAL | Status: AC | PRN
  Administered 2023-09-06: 5 mg via ORAL
  Filled 2023-09-06: qty 5

## 2023-09-06 MED ORDER — POLYETHYL GLYCOL-PROPYL GLYCOL 0.4-0.3 % OP SOLN: 1.0000 [drp] | Freq: Every day | OPHTHALMIC | Status: DC

## 2023-09-06 MED ORDER — SODIUM CHLORIDE 0.9 % IV SOLN
2.0000 g | INTRAVENOUS | Status: DC
  Filled 2023-09-06: qty 2

## 2023-09-06 MED ORDER — SUGAMMADEX SODIUM 200 MG/2ML IV SOLN
INTRAVENOUS | Status: AC
  Filled 2023-09-06: qty 2

## 2023-09-06 SURGICAL SUPPLY — 39 items
BLADE SURG 15 STRL LF DISP TIS (BLADE) ×1 IMPLANT
CAUTERY HOOK MNPLR 1.6 DVNC XI (INSTRUMENTS) ×1 IMPLANT
CHLORAPREP W/TINT 26 (MISCELLANEOUS) ×1 IMPLANT
CLIP LIGATING HEM O LOK PURPLE (MISCELLANEOUS) ×1 IMPLANT
COVER LIGHT HANDLE STERIS (MISCELLANEOUS) ×2 IMPLANT
DERMABOND ADVANCED .7 DNX12 (GAUZE/BANDAGES/DRESSINGS) ×1 IMPLANT
DRAPE ARM DVNC X/XI (DISPOSABLE) ×4 IMPLANT
DRAPE COLUMN DVNC XI (DISPOSABLE) ×1 IMPLANT
ELECT REM PT RETURN 9FT ADLT (ELECTROSURGICAL) ×1 IMPLANT
ELECTRODE REM PT RTRN 9FT ADLT (ELECTROSURGICAL) ×1 IMPLANT
FORCEPS BPLR R/ABLATION 8 DVNC (INSTRUMENTS) ×1 IMPLANT
FORCEPS PROGRASP DVNC XI (FORCEP) ×1 IMPLANT
GLOVE BIO SURGEON STRL SZ 6.5 (GLOVE) ×2 IMPLANT
GLOVE BIOGEL PI IND STRL 6.5 (GLOVE) ×2 IMPLANT
GLOVE BIOGEL PI IND STRL 7.0 (GLOVE) ×2 IMPLANT
GOWN STRL REUS W/TWL LRG LVL3 (GOWN DISPOSABLE) ×3 IMPLANT
GRASPER SUT TROCAR 14GX15 (MISCELLANEOUS) ×1 IMPLANT
KIT TURNOVER KIT A (KITS) ×1 IMPLANT
MANIFOLD NEPTUNE II (INSTRUMENTS) IMPLANT
NDL HYPO 21X1.5 SAFETY (NEEDLE) ×1 IMPLANT
NDL INSUFFLATION 14GA 120MM (NEEDLE) ×1 IMPLANT
NEEDLE HYPO 21X1.5 SAFETY (NEEDLE) ×1 IMPLANT
NEEDLE INSUFFLATION 14GA 120MM (NEEDLE) ×1 IMPLANT
OBTURATOR OPTICAL STND 8 DVNC (TROCAR) ×1 IMPLANT
OBTURATOR OPTICALSTD 8 DVNC (TROCAR) ×1 IMPLANT
PACK LAP CHOLE LZT030E (CUSTOM PROCEDURE TRAY) ×1 IMPLANT
PAD ARMBOARD 7.5X6 YLW CONV (MISCELLANEOUS) ×1 IMPLANT
PENCIL HANDSWITCHING (ELECTRODE) ×1 IMPLANT
POSITIONER HEAD 8X9X4 ADT (SOFTGOODS) ×1 IMPLANT
SEAL UNIV 5-12 XI (MISCELLANEOUS) ×4 IMPLANT
SET BASIN LINEN APH (SET/KITS/TRAYS/PACK) ×1 IMPLANT
SET TUBE SMOKE EVAC HIGH FLOW (TUBING) ×1 IMPLANT
SOL .9 NS 3000ML IRR UROMATIC (IV SOLUTION) ×1 IMPLANT
SUT MNCRL AB 4-0 PS2 18 (SUTURE) ×2 IMPLANT
SUT VICRYL 0 UR6 27IN ABS (SUTURE) ×1 IMPLANT
SYR 30ML LL (SYRINGE) ×1 IMPLANT
SYS RETRIEVAL 5MM INZII UNIV (BASKET) ×1 IMPLANT
SYSTEM RETRIEVL 5MM INZII UNIV (BASKET) ×1 IMPLANT
WATER STERILE IRR 500ML POUR (IV SOLUTION) ×1 IMPLANT

## 2023-09-06 NOTE — Anesthesia Postprocedure Evaluation (Signed)
 Anesthesia Post Note  Patient: Kathy Martin  Procedure(s) Performed: CHOLECYSTECTOMY, ROBOT-ASSISTED, LAPAROSCOPIC (Abdomen)  Patient location during evaluation: Phase II Anesthesia Type: General Level of consciousness: awake Pain management: pain level controlled Vital Signs Assessment: post-procedure vital signs reviewed and stable Respiratory status: spontaneous breathing and respiratory function stable Cardiovascular status: blood pressure returned to baseline and stable Postop Assessment: no headache and no apparent nausea or vomiting Anesthetic complications: no Comments: Late entry   No notable events documented.   Last Vitals:  Vitals:   09/06/23 1130 09/06/23 1134  BP: 104/74   Pulse: 99 (!) 101  Resp: 19 (!) 21  Temp:    SpO2: 98% 100%    Last Pain:  Vitals:   09/06/23 1134  TempSrc:   PainSc: 8                  Windell Norfolk

## 2023-09-06 NOTE — Anesthesia Procedure Notes (Signed)
 Procedure Name: Intubation Date/Time: 09/06/2023 9:01 AM  Performed by: Shanon Payor, CRNAPre-anesthesia Checklist: Patient identified, Emergency Drugs available, Suction available, Patient being monitored and Timeout performed Patient Re-evaluated:Patient Re-evaluated prior to induction Oxygen Delivery Method: Circle system utilized Preoxygenation: Pre-oxygenation with 100% oxygen Induction Type: IV induction, Rapid sequence and Cricoid Pressure applied Laryngoscope Size: Mac and 3 Grade View: Grade I Tube type: Oral Tube size: 7.0 mm Number of attempts: 1 Airway Equipment and Method: Stylet Placement Confirmation: ETT inserted through vocal cords under direct vision, positive ETCO2, CO2 detector and breath sounds checked- equal and bilateral Secured at: 22 cm Tube secured with: Tape Dental Injury: Teeth and Oropharynx as per pre-operative assessment

## 2023-09-06 NOTE — Progress Notes (Signed)
 Pt required two doses of PRN dilaudid and a dose of IV benadryl for itching. This RN had reached out to overnight coverage Dr Thomes Dinning due to patient's request for imodium. She reports she had multiple episodes of watery diarrhea. Imodium administered. NO diarrhea on this RN's shift. No acute events overnight. Kellogg RN

## 2023-09-06 NOTE — TOC CM/SW Note (Signed)
 Transition of Care [facility]) - Inpatient Brief Assessment   Patient Details  Name: Kathy Martin MRN: [id number] Date of Birth: [birth year]  Transition of Care [facility]) CM/SW Contact:    Beather Arbour Phone Number: 09/06/2023, 9:42 AM   Clinical Narrative:  Transition of Care Department [facility]) has reviewed patient and no TOC needs have been identified at this time. We will continue to monitor patient advancement through interdisciplinary progression rounds. If new patient transition needs arise, please place a TOC consult.   Transition of Care Asessment: Insurance and Status: Insurance coverage has been reviewed Patient has primary care physician: Yes Home environment has been reviewed: Single Family Home with Spouse Prior level of function:: Independent Prior/Current Home Services: No current home services Social Drivers of Health Review: SDOH reviewed no interventions necessary Readmission risk has been reviewed: Yes Transition of care needs: no transition of care needs at this time

## 2023-09-06 NOTE — Transfer of Care (Signed)
 Immediate Anesthesia Transfer of Care Note  Patient: Kathy Martin  Procedure(s) Performed: CHOLECYSTECTOMY, ROBOT-ASSISTED, LAPAROSCOPIC (Abdomen)  Patient Location: PACU  Anesthesia Type:General  Level of Consciousness: awake, alert , oriented, and patient cooperative  Airway & Oxygen Therapy: Patient Spontanous Breathing  Post-op Assessment: Report given to RN, Post -op Vital signs reviewed and stable, and Patient moving all extremities X 4  Post vital signs: Reviewed and stable  Last Vitals:  Vitals Value Taken Time  BP 106/73 09/06/23 1030  Temp 36.5 C 09/06/23 1028  Pulse 94 09/06/23 1035  Resp 15 09/06/23 1035  SpO2 99 % 09/06/23 1035  Vitals shown include unfiled device data.  Last Pain:  Vitals:   09/06/23 1028  TempSrc:   PainSc: 0-No pain      Patients Stated Pain Goal: 4 (09/06/23 1028)  Complications: No notable events documented.

## 2023-09-06 NOTE — Discharge Instructions (Addendum)
 Discharge Robotic Assisted Laparoscopic Surgery Instructions:  Common Complaints: Right shoulder pain is common after laparoscopic surgery. This is secondary to the gas used in the surgery being trapped under the diaphragm.  Walk to help your body absorb the gas. This will improve in a few days. Pain at the port sites are common, especially the larger port sites. This will improve with time.  Some nausea is common and poor appetite. The main goal is to stay hydrated the first few days after surgery.   Diet/ Activity: Diet as tolerated. You may not have an appetite, but it is important to stay hydrated. Drink 64 ounces of water a day. Your appetite will return with time.  Shower per your regular routine daily.  Do not take hot showers. Take warm showers that are less than 10 minutes. Rest and listen to your body, but do not remain in bed all day.  Walk everyday for at least 15-20 minutes. Deep cough and move around every 1-2 hours in the first few days after surgery.  Do not lift > 10 lbs, perform excessive bending, pushing, pulling, squatting for 1-2 weeks after surgery.  Do not pick at the dermabond glue on your incision sites.  This glue film will remain in place for 1-2 weeks and will start to peel off.  Do not place lotions or balms on your incision unless instructed to specifically by Dr. Henreitta Leber.   Pain Expectations and Narcotics: -After surgery you will have pain associated with your incisions and this is normal. The pain is muscular and nerve pain, and will get better with time. -You are encouraged and expected to take non narcotic medications like tylenol and ibuprofen (when able) to treat pain as multiple modalities can aid with pain treatment. -Narcotics are only used when pain is severe or there is breakthrough pain. -You are not expected to have a pain score of 0 after surgery, as we cannot prevent pain. A pain score of 3-4 that allows you to be functional, move, walk, and tolerate  some activity is the goal. The pain will continue to improve over the days after surgery and is dependent on your surgery. -Due to [place] law, we are only able to give a certain amount of pain medication to treat post operative pain, and we only give additional narcotics on a patient by patient basis.  -For most laparoscopic surgery, studies have shown that the majority of patients only need 10-15 narcotic pills, and for open surgeries most patients only need 15-20.   -Having appropriate expectations of pain and knowledge of pain management with non narcotics is important as we do not want anyone to become addicted to narcotic pain medication.  -Using ice packs in the first 48 hours and heating pads after 48 hours, wearing an abdominal binder (when recommended), and using over the counter medications are all ways to help with pain management.   -Simple acts like meditation and mindfulness practices after surgery can also help with pain control and research has proven the benefit of these practices.  Medication: Take tylenol and ibuprofen as needed for pain control, alternating every 4-6 hours.  Example:  Tylenol 1000mg  @ 6am, 12noon, 6pm, (Do not exceed 4000mg  of tylenol a day). Ibuprofen 800mg  @ 9am, 3pm, 9pm, 3am (Do not exceed 3600mg  of ibuprofen a day).  Take Roxicodone for breakthrough pain every 4 hours.  Take Colace for constipation related to narcotic pain medication. If you do not have a bowel movement in 2 days,  take Miralax over the counter.  Drink plenty of water to also prevent constipation.   Contact Information: If you have questions or concerns, please call our office, [phone], Monday- Thursday 8AM-5PM and Friday 8AM-12Noon.  If it is after hours or on the weekend, please call [facility] Main Number, [phone], [phone], and ask to speak to the surgeon on call for Dr. Henreitta Leber at [facility].

## 2023-09-06 NOTE — Progress Notes (Addendum)
 [facility]  Updated husband. Will see how she feels this afternoon and likely dc home. Note for work written, will need to see other info to the office if needed for FMLA/ etc.   Algis Greenhouse, MD [facility] [address] [place], [place] [address] [phone] (office)

## 2023-09-06 NOTE — Plan of Care (Signed)
   Problem: Education: Goal: Knowledge of General Education information will improve Description: Including pain rating scale, medication(s)/side effects and non-pharmacologic comfort measures Outcome: Progressing   Problem: Clinical Measurements: Goal: Ability to maintain clinical measurements within normal limits will improve Outcome: Progressing

## 2023-09-06 NOTE — Anesthesia Preprocedure Evaluation (Signed)
 Anesthesia Evaluation  Patient identified by MRN, date of birth, ID band Patient awake    Reviewed: Allergy & Precautions, H&P , NPO status , Patient's Chart, lab work & pertinent test results, reviewed documented beta blocker date and time   Airway Mallampati: II  TM Distance: >3 FB Neck ROM: full    Dental no notable dental hx.    Pulmonary neg pulmonary ROS   Pulmonary exam normal breath sounds clear to auscultation       Cardiovascular Exercise Tolerance: Good hypertension, negative cardio ROS  Rhythm:regular Rate:Normal     Neuro/Psych negative neurological ROS  negative psych ROS   GI/Hepatic negative GI ROS, Neg liver ROS,,,  Endo/Other  negative endocrine ROS    Renal/GU negative Renal ROS  negative genitourinary   Musculoskeletal   Abdominal   Peds  Hematology negative hematology ROS (+)   Anesthesia Other Findings   Reproductive/Obstetrics negative OB ROS                             Anesthesia Physical Anesthesia Plan  ASA: 2  Anesthesia Plan: General, General ETT and Rapid Sequence   Post-op Pain Management:    Induction:   PONV Risk Score and Plan: Ondansetron  Airway Management Planned:   Additional Equipment:   Intra-op Plan:   Post-operative Plan:   Informed Consent: I have reviewed the patients History and Physical, chart, labs and discussed the procedure including the risks, benefits and alternatives for the proposed anesthesia with the patient or authorized representative who has indicated his/her understanding and acceptance.     Dental Advisory Given  Plan Discussed with: CRNA  Anesthesia Plan Comments:        Anesthesia Quick Evaluation

## 2023-09-06 NOTE — Discharge Summary (Addendum)
 Physician Discharge Summary  Patient ID: Kathy Martin MRN: [id number] DOB/AGE: Nov 12, 1980 43 y.o.  Admit date: 09/05/2023 Discharge date: 09/07/2023  Admission Diagnoses: Gallstones and intractable abdominal pain and nausea  Discharge Diagnoses:  Principal Problem:   Gallstones Active Problems:   Acute on chronic cholecystitis   Discharged Condition: good  Hospital Course: Kathy Martin is a 43 yo who came in with a large stone and pain that was intractable and nausea. She had been dealing with this for a while and this pain was worsening. Her labs were reassuring but given the pain and issues she was admitted for cholecystectomy. She underwent robotic assisted cholecystectomy and did fair post op. The large stone was noted and she was having pain in the left port site was the gallbladder was extracted due to the larger incision and stretching/ suturing.  She stayed overnight for pain control and monitoring. She is doing well but still sore. She did report some blurry vision in the close vision but not in the far vision. This is likely from medications and anesthesia and should improve. No other neurologic symptoms.   Consults: None  Significant Diagnostic Studies:   Latest Reference Range & Units 09/07/23 04:52  Sodium 135 - 145 mmol/L 136  Potassium 3.5 - 5.1 mmol/L 3.9  Chloride 98 - 111 mmol/L 109  CO2 22 - 32 mmol/L 21 (L)  Glucose 70 - 99 mg/dL 98  BUN 6 - 20 mg/dL 7  Creatinine 4.09 - 8.11 mg/dL 9.14  Calcium 8.9 - 78.2 mg/dL 8.7 (L)  Anion gap 5 - 15  6  Alkaline Phosphatase 38 - 126 U/L 37 (L)  Albumin 3.5 - 5.0 g/dL 3.3 (L)  AST 15 - 41 U/L 35  ALT 0 - 44 U/L 31  Total Protein 6.5 - 8.1 g/dL 5.7 (L)  Total Bilirubin 0.0 - 1.2 mg/dL 0.9  (L): Data is abnormally low  Treatments: IV hydration and Robotic assisted cholecystectomy 09/06/23   Discharge Exam: Blood pressure 111/73, pulse 87, temperature 98.2 F (36.8 C), temperature source Oral, resp. rate 18, height 5\' 3"   (1.6 m), weight 69.5 kg, last menstrual period 08/14/2023, SpO2 100%. General appearance: alert and no distress Eyes: conjunctivae/corneas clear. PERRL, EOM's intact. Fundi benign., pupils dilated but reactive GI: soft, nondistended, appropriately tender Moves all extremities and no weakness, no other neurologic issues   Disposition: Discharge disposition: 01-Home or Self Care       Discharge Instructions     Call MD for:  difficulty breathing, headache or visual disturbances   Complete by: As directed    Call MD for:  extreme fatigue   Complete by: As directed    Call MD for:  persistant dizziness or light-headedness   Complete by: As directed    Call MD for:  persistant nausea and vomiting   Complete by: As directed    Call MD for:  redness, tenderness, or signs of infection (pain, swelling, redness, odor or green/yellow discharge around incision site)   Complete by: As directed    Call MD for:  severe uncontrolled pain   Complete by: As directed    Call MD for:  temperature >100.4   Complete by: As directed    Increase activity slowly   Complete by: As directed       Allergies as of 09/07/2023   No Known Allergies      Medication List     STOP taking these medications    ondansetron 4 MG tablet Commonly  known as: ZOFRAN       TAKE these medications    FLUoxetine 10 MG capsule Commonly known as: PROZAC Take 10 mg by mouth daily.   levothyroxine 50 MCG tablet Commonly known as: SYNTHROID Take 50 mcg by mouth daily before breakfast.   ondansetron 4 MG disintegrating tablet Commonly known as: ZOFRAN-ODT Take 1 tablet (4 mg total) by mouth every 6 (six) hours as needed for nausea.   oxyCODONE 5 MG immediate release tablet Commonly known as: Oxy IR/ROXICODONE Take 1 tablet (5 mg total) by mouth every 4 (four) hours as needed for moderate pain (pain score 4-6), severe pain (pain score 7-10) or breakthrough pain.   Semaglutide-Weight Management 2.4  MG/0.75ML Soaj Inject 2.4 mg into the skin once a week.   Systane Ultra 0.4-0.3 % Soln Generic drug: Polyethyl Glycol-Propyl Glycol Place 1 drop into both eyes daily.   traZODone 50 MG tablet Commonly known as: DESYREL Take 25-50 mg by mouth at bedtime.        Follow-up Information     Lucretia Roers, MD Follow up on 09/25/2023.   Specialty: General Surgery Why: phone call, if you need to be seen in person call the office Contact information: [address] [place] [facility] [address] [phone]                 Signed: Lucretia Roers 09/07/2023, 11:00 AM

## 2023-09-06 NOTE — Op Note (Signed)
 [facility] Operative Note  09/06/23  Preoperative Diagnosis: Symptomatic Cholelithiasis, intractable nausea and pain   Postoperative Diagnosis: Acute on chronic cholecystitis    Procedure(s) Performed: Robotic Assisted Laparoscopic Cholecystectomy   Surgeon: Lillia Abed C. Henreitta Leber, MD   Assistants: No qualified resident was available    Anesthesia: General endotracheal   Anesthesiologist: Dr. Johnnette Litter, MD    Specimens: Gallbladder   Estimated Blood Loss: Minimal   Blood Replacement: None    Complications: None   Wound Class: Contaminated    Operative Indications: The patient was found to have stones on imaging and was symptomatic with intractable pain and nausea.  We discussed the risk of the procedure including but not limited to bleeding, infection, injury to the common bile duct, bile leak, need for further procedures, chance of subtotal cholecystectomy.   Findings:  Large gallstone No filling of the gallbladder with Firefly- Acute cholecystitis  Critical view of safety noted All clips intact at the end of the case Adequate hemostasis   Procedure: Firefly was given in the preoperative area. The patient was taken to the operating room and placed supine. General endotracheal anesthesia was induced. Intravenous antibiotics were administered per protocol.  An orogastric tube positioned to decompress the stomach. The abdomen was prepared and draped in the usual sterile fashion.   Veress needle was placed at the supraumbilical area and insufflation was started after confirming a positive saline drop test and no immediate increase in abdominal pressure.  After reaching 15 mm, the Veress needle was removed and a 8 mm port was placed via optiview technique supraumbilical, measuring 20 mm away from the suspected position of the gallbladder.  The abdomen was inspected and no abnormalities or injuries were found.  Under direct vision, ports were placed in the following  locations in a semi curvilinear position around the target of the gallbladder: Two 8 mm ports on the patient's right each having 8cm clearance to the adjacent ports and one 8 mm port placed on the patient's left 8 cm from the umbilical port. Once ports were placed, the table was placed in the reverse Trendelenburg position with the right side up. The Xi platform was brought into the operative field and docked to the ports successfully.  An endoscope was placed through the umbilical port, prograsper through the most lateral right port, forced bipolar to the port just right of the umbilicus, and then a hook cautery in the left port.   The dome of the gallbladder was grasped with prograsp and retracted over the dome of the liver. Adhesions between the gallbladder and omentum, duodenum and transverse colon were lysed via hook cautery. The infundibulum was grasped with the forced bipolar and retracted toward the right lower quadrant. This maneuver exposed Calot's triangle. Firefly was used throughout the dissection to ensure safe visualization of the cystic duct. There was no filling of the gallbladder consistent with cholecystitis. The peritoneum overlying the gallbladder infundibulum was then dissected and the cystic duct and cystic artery identified.  Critical view of safety with the liver bed clearly visible behind the duct and artery with no additional structures noted.  The cystic duct and cystic artery were doubly clipped and divided close to the gallbladder.    The gallbladder was then dissected from its peritoneal and liver bed attachments by electrocautery. There was some spillage of bile that was suctioned out. Hemostasis was checked prior to removing the hook cautery.  A 5mm Endo Catch bag was then placed through the left side port.  The [employer] was undocked and moved out of the field,  and the gallbladder was removed in the bag.  The gallbladder was passed off the table as a specimen. There was no evidence of  bleeding from the gallbladder fossa or cystic artery or leakage of the bile from the cystic duct stump. The left port site closed with a 0 vicryl and PMI due to dilation from removing the gallbladder.The abdomen was desufflated and secondary trocars were removed under direct vision.   No bleeding was noted. All skin incisions were closed with subcuticular sutures of 4-0 monocryl and dermabond.   Her gallbladder had a large stone in it and she wanted a photo. I removed the stone from the gallbladder.    Final inspection revealed acceptable hemostasis. All counts were correct at the end of the case. The patient was awakened from anesthesia and extubated without complication. The OG tube was removed.  The patient went to the PACU in stable condition.   Algis Greenhouse, MD [facility] [address] [place], [place] [address] [phone] (office)

## 2023-09-06 NOTE — Interval H&P Note (Signed)
 History and Physical Interval Note:  09/06/2023 8:25 AM  Kathy Martin  has presented today for surgery, with the diagnosis of gallstones and intractable pain.  The various methods of treatment have been discussed with the patient and family. After consideration of risks, benefits and other options for treatment, the patient has consented to  Procedure(s): CHOLECYSTECTOMY, ROBOT-ASSISTED, LAPAROSCOPIC (N/A) as a surgical intervention.  The patient's history has been reviewed, patient examined, no change in status, stable for surgery.  I have reviewed the patient's chart and labs.  Questions were answered to the patient's satisfaction.    Discussed slight elevation T bili I think related to gallbladder but she has had an enlarged CBD since 2024 to 7mm so she could have passed stones in the past. The main stone is 2.4cm so not passing that, but warned of her of what to look for if she is having issues post op. I do not think this is likely but just gave her the information.   Lucretia Roers

## 2023-09-07 LAB — COMPREHENSIVE METABOLIC PANEL
ALT: 31 U/L (ref 0–44)
AST: 35 U/L (ref 15–41)
Albumin: 3.3 g/dL — ABNORMAL LOW (ref 3.5–5.0)
Alkaline Phosphatase: 37 U/L — ABNORMAL LOW (ref 38–126)
Anion gap: 6 (ref 5–15)
BUN: 7 mg/dL (ref 6–20)
CO2: 21 mmol/L — ABNORMAL LOW (ref 22–32)
Calcium: 8.7 mg/dL — ABNORMAL LOW (ref 8.9–10.3)
Chloride: 109 mmol/L (ref 98–111)
Creatinine, Ser: 0.78 mg/dL (ref 0.44–1.00)
GFR, Estimated: >60 mL/min (ref >60)
Glucose, Bld: 98 mg/dL (ref 70–99)
Potassium: 3.9 mmol/L (ref 3.5–5.1)
Sodium: 136 mmol/L (ref 135–145)
Total Bilirubin: 0.9 mg/dL (ref 0.0–1.2)
Total Protein: 5.7 g/dL — ABNORMAL LOW (ref 6.5–8.1)

## 2023-09-07 NOTE — Progress Notes (Signed)
 Pt required two doses of PRN pain medication. She was able to ambulate around the room independently and wash up on her own. She reports she is ready to go home. Port site incisions look great. No drainage,increased redness, and abnormal bruising. She would like dietary recommendations prior to discharge. NO acute events overnight. Kellogg RN

## 2023-09-07 NOTE — Plan of Care (Signed)
   Problem: Clinical Measurements: Goal: Diagnostic test results will improve Outcome: Progressing

## 2023-09-10 LAB — SURGICAL PATHOLOGY

## 2023-09-21 ENCOUNTER — Telehealth: Payer: Self-pay | Admitting: *Deleted

## 2023-09-21 NOTE — Telephone Encounter (Signed)
 LATE DOCUMENTATION FOR 09/20/2023:  Surgical Date: 09/06/2023 Procedure: XI robotic Assisted Laparoscopic Cholecystectomy   Received call from patient [phone]~ telephone.   Patient reports that she has had increased loose, watery stools. Reports that the first week following the surgery, she did have some constipation that has since resolved. States that she has loose stools again much like she had prior to lap chole. States that she feels like she cannot eat anything without immediately having to have a BM. Reports that she can feel when food or water hits her stomach.   Advised that following cholecystectomy, patients should adhere to low fat diet as digestion realigns without the bile from the gallbladder.   Patient states that she is taking OTC Imodium. Advised to add fiber to assist in bulking up stools.   Patient states that she will try this and will follow up with Dr. Henreitta Leber next week at her post op telephone call.

## 2023-09-25 ENCOUNTER — Ambulatory Visit (INDEPENDENT_AMBULATORY_CARE_PROVIDER_SITE_OTHER): Admitting: General Surgery

## 2023-09-25 DIAGNOSIS — K812 Acute cholecystitis with chronic cholecystitis: Secondary | ICD-10-CM

## 2023-09-25 MED ORDER — NYSTATIN 100000 UNIT/ML MT SUSP: 5.0000 mL | Freq: Four times a day (QID) | OROMUCOSAL | 0 refills | Status: DC

## 2023-09-25 NOTE — Progress Notes (Signed)
 [facility]  I am calling the patient for post operative evaluation. This is not a billable encounter as it is under the global charges for the surgery.  The patient had a robotic cholecystectomy on 3/6. The patient reports that she is doing well. The are tolerating a diet, having good pain control, and having regular Bms but did have some issues with pain and diarrhea that started last week but only lasted a day. She is gassy at times /bloated but is trying to probiotic for that.  The incisions are healing but the left side is causing some more pain and it has flared up Sunday. The patient has no other concerns.   She may end up and have some degree of IBS that is contributing to the diarrhea. She will need to see how things are doing. If still having issues she will give [place] a call.   She has had some thrush and tried some old diflucan but the thrush has come back. This could be related to antibiotics from surgery.   Pathology:  A. GALLBLADDER, CHOLECYSTECTOMY:       Chronic cholecystitis.       Cholelithiasis.   Will see the patient PRN.  Nystatin swish has been sent in for oral thrush.   Algis Greenhouse, MD [facility] [address] [place], [place] [address] [phone] (office)

## 2023-11-27 NOTE — Progress Notes (Unsigned)
 Chief Complaint:*** Primary GI Doctor:Dr. Karene Oto   HPI: 43 year old female who follows in the GI clinic for the following:   Has had "GI issues" for many years. Will have diarrhea x1 week with abdominal bloating and visible distension, nausea, then resolves. Has a few episodes/year.  Has not pinpointed any specific food triggers or other exacerbating features. - 07/2022: Normal CBC, CMP, TSH - 07/2022: Renal ultrasound: 8 mm right kidney stone, cholelithiasis - 08/02/2022: Initial appointment in the GI clinic.  Main issue was nausea, RUQ pain, bloating, superimposed on longstanding history of alternating bowel habits.  Placed referral to urology for nephrolithiasis and scheduled for CT and EGD - 08/03/2022: EGD: Normal esophagus, many fundic gland polyps (removed over 20), otherwise normal stomach, normal duodenum (gastric and duodenal path benign) - 08/09/2022: CT A/P: Normal GI tract and liver.  Cholelithiasis without cholecystitis.  5 mm right renal calculus.  If ongoing symptoms, recommended HIDA --09/20/22 last seen in GI office by Dr. Karene Oto for nausea, bloating, abdominal pain.Main issue today is continued nausea and postprandial bloating.  --- 11/23/22 HIDA scan ordered-demonstrate normal gallbladder ejection fraction of 41% with normal, patent cystic and common bile ducts.    ---09/05/23 presented to ED with abdominal pain, nausea, and diarrhea. They proceeded with surgery due to her chronic issues and poorly controlled pain. ---09/06/23 cholecystectomy***     Interval History  Patient admits/denies GERD Patient admits/denies dysphagia Patient admits/denies nausea, vomiting, or weight loss  Patient admits/denies altered bowel habits Patient admits/denies abdominal pain Patient admits/denies rectal bleeding   Denies/Admits alcohol  Denies/Admits smoking Denies/Admits NSAID use. Denies/Admits they are on blood thinners.  Patients last colonoscopy Patients last  EGD  Patient's family history includes  Wt Readings from Last 3 Encounters:  09/05/23 153 lb 3.5 oz (69.5 kg)  09/20/22 154 lb (69.9 kg)  08/03/22 151 lb (68.5 kg)      Past Medical History:  Diagnosis Date   Anxiety    Arthritis    Gallstone    Hypothyroidism    Kidney stones    Obesity    Palpitations    PONV (postoperative nausea and vomiting)     Past Surgical History:  Procedure Laterality Date   ANTERIOR CERVICAL DECOMP/DISCECTOMY FUSION N/A 06/07/2018   Procedure: C5-6, C-6-7 ANTERIOR CERVICAL DECOMPRESSION/DISCECTOMY FUSION, ALLOGRAFT, PLATE;  Surgeon: Adah Acron, MD;  Location: [facility] OR;  Service: Orthopedics;  Laterality: N/A;   BREAST REDUCTION SURGERY  03/2013   CESAREAN SECTION  2011, 2013   x2   DILATION AND CURETTAGE OF UTERUS     GANGLION CYST EXCISION     Right   mucinous tumor  2010   OOPHORECTOMY  2010   rt    Current Outpatient Medications  Medication Sig Dispense Refill   FLUoxetine  (PROZAC ) 10 MG capsule Take 10 mg by mouth daily.     levothyroxine  (SYNTHROID , LEVOTHROID) 50 MCG tablet Take 50 mcg by mouth daily before breakfast.     nystatin  (MYCOSTATIN ) 100000 UNIT/ML suspension Take 5 mLs (500,000 Units total) by mouth 4 (four) times daily. 60 mL 0   ondansetron  (ZOFRAN -ODT) 4 MG disintegrating tablet Take 1 tablet (4 mg total) by mouth every 6 (six) hours as needed for nausea. 20 tablet 0   oxyCODONE  (OXY IR/ROXICODONE ) 5 MG immediate release tablet Take 1 tablet (5 mg total) by mouth every 4 (four) hours as needed for moderate pain (pain score 4-6), severe pain (pain score 7-10) or breakthrough pain. 8 tablet 0   Polyethyl Glycol-Propyl  Glycol (SYSTANE ULTRA) 0.4-0.3 % SOLN Place 1 drop into both eyes daily.     Semaglutide-Weight Management 2.4 MG/0.75ML SOAJ Inject 2.4 mg into the skin once a week.     traZODone  (DESYREL ) 50 MG tablet Take 25-50 mg by mouth at bedtime.     No current facility-administered medications for this visit.     Allergies as of 11/28/2023   (No Known Allergies)    Family History  Problem Relation Age of Onset   Barrett's esophagus Maternal Grandmother    Colon cancer Neg Hx    Esophageal cancer Neg Hx     Review of Systems:    Constitutional: No weight loss, fever, chills, weakness or fatigue HEENT: Eyes: No change in vision               Ears, Nose, Throat:  No change in hearing or congestion Skin: No rash or itching Cardiovascular: No chest pain, chest pressure or palpitations   Respiratory: No SOB or cough Gastrointestinal: See HPI and otherwise negative Genitourinary: No dysuria or change in urinary frequency Neurological: No headache, dizziness or syncope Musculoskeletal: No new muscle or joint pain Hematologic: No bleeding or bruising Psychiatric: No history of depression or anxiety    Physical Exam:  Vital signs: There were no vitals taken for this visit.  Constitutional:   Pleasant *** female appears to be in NAD, Well developed, Well nourished, alert and cooperative Head:  Normocephalic and atraumatic. Eyes:   PEERL, EOMI. No icterus. Conjunctiva pink. Ears:  Normal auditory acuity. Neck:  Supple Throat: Oral cavity and pharynx without inflammation, swelling or lesion.  Respiratory: Respirations even and unlabored. Lungs clear to auscultation bilaterally.   No wheezes, crackles, or rhonchi.  Cardiovascular: Normal S1, S2. Regular rate and rhythm. No peripheral edema, cyanosis or pallor.  Gastrointestinal:  Soft, nondistended, nontender. No rebound or guarding. Normal bowel sounds. No appreciable masses or hepatomegaly. Rectal:  Not performed.  Anoscopy: Msk:  Symmetrical without gross deformities. Without edema, no deformity or joint abnormality.  Neurologic:  Alert and  oriented x4;  grossly normal neurologically.  Skin:   Dry and intact without significant lesions or rashes. Psychiatric: Oriented to person, place and time. Demonstrates good judgement and reason  without abnormal affect or behaviors.  RELEVANT LABS AND IMAGING: CBC    Latest Ref Rng & Units 09/06/2023    5:09 AM 09/05/2023    9:46 AM 12/31/2018   12:53 PM  CBC  WBC 4.0 - 10.5 K/uL 5.3  6.3  7.6   Hemoglobin 12.0 - 15.0 g/dL 61.4  43.1  54.0   Hematocrit 36.0 - 46.0 % 34.4  38.0  38.7   Platelets 150 - 400 K/uL 177  222  255.0      CMP     Latest Ref Rng & Units 09/07/2023    4:52 AM 09/06/2023    5:09 AM 09/05/2023    9:46 AM  CMP  Glucose 70 - 99 mg/dL 98  58  89   BUN 6 - 20 mg/dL 7  14  11    Creatinine 0.44 - 1.00 mg/dL 0.86  7.61  9.50   Sodium 135 - 145 mmol/L 136  135  138   Potassium 3.5 - 5.1 mmol/L 3.9  3.7  3.8   Chloride 98 - 111 mmol/L 109  110  107   CO2 22 - 32 mmol/L 21  18  22    Calcium 8.9 - 10.3 mg/dL 8.7  8.4  9.2  Total Protein 6.5 - 8.1 g/dL 5.7  5.8  7.0   Total Bilirubin 0.0 - 1.2 mg/dL 0.9  1.3  0.8   Alkaline Phos 38 - 126 U/L 37  38  46   AST 15 - 41 U/L 35  13  14   ALT 0 - 44 U/L 31  11  13       Lab Results  Component Value Date   TSH 0.43 12/31/2018     Assessment: 1. ***  Plan: 1. ***   Thank you for the courtesy of this consult. Please call me with any questions or concerns.   Carmel Waddington, FNP-C [facility] 11/27/2023, 6:21 PM  Cc: Blair, Diane W, FNP

## 2023-11-28 ENCOUNTER — Telehealth: Payer: Self-pay | Admitting: Gastroenterology

## 2023-11-28 ENCOUNTER — Ambulatory Visit (INDEPENDENT_AMBULATORY_CARE_PROVIDER_SITE_OTHER): Admitting: Gastroenterology

## 2023-11-28 ENCOUNTER — Ambulatory Visit (INDEPENDENT_AMBULATORY_CARE_PROVIDER_SITE_OTHER)
Admission: RE | Admit: 2023-11-28 | Discharge: 2023-11-28 | Disposition: A | Discharge status: Home / Self Care | Source: Ambulatory Visit | Attending: Gastroenterology | Admitting: Gastroenterology

## 2023-11-28 ENCOUNTER — Encounter: Payer: Self-pay | Admitting: Gastroenterology

## 2023-11-28 ENCOUNTER — Ambulatory Visit: Payer: Self-pay | Admitting: Gastroenterology

## 2023-11-28 VITALS — BP 110/60 | HR 86 | Ht 63.0 in | Wt 135.0 lb

## 2023-11-28 DIAGNOSIS — R140 Abdominal distension (gaseous): Secondary | ICD-10-CM

## 2023-11-28 DIAGNOSIS — R194 Change in bowel habit: Secondary | ICD-10-CM

## 2023-11-28 DIAGNOSIS — R109 Unspecified abdominal pain: Secondary | ICD-10-CM | POA: Diagnosis not present

## 2023-11-28 DIAGNOSIS — R110 Nausea: Secondary | ICD-10-CM

## 2023-11-28 MED ORDER — NA SULFATE-K SULFATE-MG SULF 17.5-3.13-1.6 GM/177ML PO SOLN: 1.0000 | Freq: Once | ORAL | 0 refills | Status: AC

## 2023-11-28 NOTE — Patient Instructions (Addendum)
--  Wait for results of abdominal xray first Then once you get call from our office to start samples you can take as following Linzess 72 mcg 1 tablet po daily 30-45 minutes before first meal of day, may have loose stools first few days of starting medication.   You have been scheduled for a colonoscopy. Please follow written instructions given to you at your visit today.   If you use inhalers (even only as needed), please bring them with you on the day of your procedure.  DO NOT TAKE 7 DAYS PRIOR TO TEST- Trulicity (dulaglutide) Ozempic, Wegovy (semaglutide) Mounjaro (tirzepatide) Bydureon Bcise (exanatide extended release)  DO NOT TAKE 1 DAY PRIOR TO YOUR TEST Rybelsus (semaglutide) Adlyxin (lixisenatide) Victoza (liraglutide) Byetta (exanatide) ___________________________________________________________________________  Due to recent changes in healthcare laws, you may see the results of your imaging and laboratory studies on MyChart before your provider has had a chance to review them.  We understand that in some cases there may be results that are confusing or concerning to you. Not all laboratory results come back in the same time frame and the provider may be waiting for multiple results in order to interpret others.  Please give us  48 hours in order for your provider to thoroughly review all the results before contacting the office for clarification of your results.   _______________________________________________________  If your blood pressure at your visit was 140/90 or greater, please contact your primary care physician to follow up on this.  _______________________________________________________  If you are age 41 or older, your body mass index should be between 23-30. Your Body mass index is 23.91 kg/m. If this is out of the aforementioned range listed, please consider follow up with your Primary Care Provider.  If you are age 70 or younger, your body mass index should  be between 19-25. Your Body mass index is 23.91 kg/m. If this is out of the aformentioned range listed, please consider follow up with your Primary Care Provider.   ________________________________________________________  The [facility] providers would like to encourage you to use MYCHART to communicate with providers for non-urgent requests or questions.  Due to long hold times on the telephone, sending your provider a message by [facility] may be a faster and more efficient way to get a response.  Please allow 48 business hours for a response.  Please remember that this is for non-urgent requests.  _______________________________________________________ Thank you for trusting me with your gastrointestinal care. Deanna May, RNP

## 2023-11-28 NOTE — Telephone Encounter (Signed)
 Call patient about x-ray results showing moderate stool burden.  Patient instructed to start Linzess samples tomorrow morning.  If patient responds well to medication she was instructed to call office for prescription.  Patient verbalizes understanding.

## 2024-01-08 NOTE — Progress Notes (Signed)
 Agree with the assessment and plan as outlined by [facility], FNP-C.  Carlitos Bottino, DO, [facility]

## 2024-01-15 ENCOUNTER — Ambulatory Visit (AMBULATORY_SURGERY_CENTER): Admitting: Gastroenterology

## 2024-01-15 ENCOUNTER — Encounter: Payer: Self-pay | Admitting: Gastroenterology

## 2024-01-15 VITALS — BP 102/66 | HR 77 | Temp 97.9°F | Resp 12 | Ht 63.0 in | Wt 135.0 lb

## 2024-01-15 DIAGNOSIS — Q439 Congenital malformation of intestine, unspecified: Secondary | ICD-10-CM

## 2024-01-15 DIAGNOSIS — R1084 Generalized abdominal pain: Secondary | ICD-10-CM

## 2024-01-15 DIAGNOSIS — R194 Change in bowel habit: Secondary | ICD-10-CM

## 2024-01-15 DIAGNOSIS — K640 First degree hemorrhoids: Secondary | ICD-10-CM

## 2024-01-15 DIAGNOSIS — K5900 Constipation, unspecified: Secondary | ICD-10-CM

## 2024-01-15 DIAGNOSIS — K648 Other hemorrhoids: Secondary | ICD-10-CM | POA: Diagnosis present

## 2024-01-15 MED ORDER — LINACLOTIDE 145 MCG PO CAPS: 145.0000 ug | ORAL_CAPSULE | Freq: Every day | ORAL | 3 refills | Status: DC

## 2024-01-15 MED ORDER — SODIUM CHLORIDE 0.9 % IV SOLN: 500.0000 mL | Freq: Once | INTRAVENOUS | Status: DC

## 2024-01-15 NOTE — Op Note (Signed)
 [facility] Patient Name: Kathy Martin Procedure Date: 01/15/2024 9:14 AM MRN: [id number] Endoscopist: Sandor Flatter , MD, [id number] Age: 43 Referring MD:  Date of Birth: [birth year] Gender: Female Account #: [id number] Procedure:                Colonoscopy Indications:              Change in bowel habits, Constipation Medicines:                Monitored Anesthesia Care Procedure:                Pre-Anesthesia Assessment:                           - Prior to the procedure, a History and Physical                            was performed, and patient medications and                            allergies were reviewed. The patient's tolerance of                            previous anesthesia was also reviewed. The risks                            and benefits of the procedure and the sedation                            options and risks were discussed with the patient.                            All questions were answered, and informed consent                            was obtained. Prior Anticoagulants: The patient has                            taken no anticoagulant or antiplatelet agents. ASA                            Grade Assessment: II - A patient with mild systemic                            disease. After reviewing the risks and benefits,                            the patient was deemed in satisfactory condition to                            undergo the procedure.                           After obtaining informed consent, the colonoscope  was passed under direct vision. Throughout the                            procedure, the patient's blood pressure, pulse, and                            oxygen saturations were monitored continuously. The                            CF [id number] #[id number] was introduced through the anus                            and advanced to the the cecum, identified by                            appendiceal orifice and  ileocecal valve. The                            colonoscopy was technically difficult and complex                            due to a tortuous colon. The patient tolerated the                            procedure well. The quality of the bowel                            preparation was good. The ileocecal valve,                            appendiceal orifice, and rectum were photographed. Scope In: 9:25:00 AM Scope Out: 9:39:17 AM Scope Withdrawal Time: 0 hours 8 minutes 49 seconds  Total Procedure Duration: 0 hours 14 minutes 17 seconds  Findings:                 The perianal and digital rectal examinations were                            normal.                           The entire colon appeared normal. No strictures,                            luminal narrowing, obstructions. No areas of                            erythema, edema, erosions.                           The sigmoid colon was significantly tortuous.                            Advancing the scope required straightening and  shortening the scope to obtain bowel loop reduction                            along with water  immersion.                           Non-bleeding internal hemorrhoids were found during                            retroflexion. The hemorrhoids were small. Complications:            No immediate complications. Estimated Blood Loss:     Estimated blood loss: none. Impression:               - The entire examined colon is normal.                           - Tortuous colon.                           - Non-bleeding internal hemorrhoids.                           - No specimens collected. Recommendation:           - Patient has a contact number available for                            emergencies. The signs and symptoms of potential                            delayed complications were discussed with the                            patient. Return to normal activities tomorrow.                             Written discharge instructions were provided to the                            patient.                           - Resume previous diet.                           - Continue present medications.                           - Restart Linzess  at 145 mcg daily.                           - If no appreciable improvement with intermediate                            dose of Linzess , can either increase dose or workup  further with sitz marker study +/- anorectal                            manometry.                           - Follow-up in the GI clinic in 4-8 weeks or sooner                            as needed.                           - Repeat colonoscopy in 10 years for screening                            purposes. Sandor Flatter, MD 01/15/2024 9:50:41 AM

## 2024-01-15 NOTE — Patient Instructions (Signed)
-  Repeat colonoscopy in 10 years for screening. -continue medications  -follow up appointment 03/24/24 @ 9:40am  YOU HAD AN ENDOSCOPIC PROCEDURE TODAY AT THE [facility]:   Refer to the procedure report that was given to you for any specific questions about what was found during the examination.  If the procedure report does not answer your questions, please call your gastroenterologist to clarify.  If you requested that your care partner not be given the details of your procedure findings, then the procedure report has been included in a sealed envelope for you to review at your convenience later.  YOU SHOULD EXPECT: Some feelings of bloating in the abdomen. Passage of more gas than usual.  Walking can help get rid of the air that was put into your GI tract during the procedure and reduce the bloating. If you had a lower endoscopy (such as a colonoscopy or flexible sigmoidoscopy) you may notice spotting of blood in your stool or on the toilet paper. If you underwent a bowel prep for your procedure, you may not have a normal bowel movement for a few days.  Please Note:  You might notice some irritation and congestion in your nose or some drainage.  This is from the oxygen used during your procedure.  There is no need for concern and it should clear up in a day or so.  SYMPTOMS TO REPORT IMMEDIATELY:  Following lower endoscopy (colonoscopy or flexible sigmoidoscopy):  Excessive amounts of blood in the stool  Significant tenderness or worsening of abdominal pains  Swelling of the abdomen that is new, acute  Fever of 100F or higher  For urgent or emergent issues, a gastroenterologist can be reached at any hour by calling [phone]. Do not use MyChart messaging for urgent concerns.    DIET:  We do recommend a small meal at first, but then you may proceed to your regular diet.  Drink plenty of fluids but you should avoid alcoholic beverages for 24 hours.  ACTIVITY:  You should  plan to take it easy for the rest of today and you should NOT DRIVE or use heavy machinery until tomorrow (because of the sedation medicines used during the test).    FOLLOW UP: Our staff will call the number listed on your records the next business day following your procedure.  We will call around 7:15- 8:00 am to check on you and address any questions or concerns that you may have regarding the information given to you following your procedure. If we do not reach you, we will leave a message.     If any biopsies were taken you will be contacted by phone or by letter within the next 1-3 weeks.  Please call us  at [phone] if you have not heard about the biopsies in 3 weeks.    SIGNATURES/CONFIDENTIALITY: You and/or your care partner have signed paperwork which will be entered into your electronic medical record.  These signatures attest to the fact that that the information above on your After Visit Summary has been reviewed and is understood.  Full responsibility of the confidentiality of this discharge information lies with you and/or your care-partner.

## 2024-01-15 NOTE — Progress Notes (Signed)
 To pacu, VSS. Report to [email or web address]

## 2024-01-15 NOTE — Progress Notes (Signed)
 GASTROENTEROLOGY PROCEDURE H&P NOTE   Primary Care Physician: Almeda Loa ORN, FNP    Reason for Procedure:  Change in bowel habits, constipation, abdominal bloating, abdominal distention  Plan:    Colonoscopy  Patient is appropriate for endoscopic procedure(s) in the ambulatory ([facility]) setting.  The nature of the procedure, as well as the risks, benefits, and alternatives were carefully and thoroughly reviewed with the patient. Ample time for discussion and questions allowed. The patient understood, was satisfied, and agreed to proceed.     HPI: Kathy Martin is a 43 y.o. female who presents for colonoscopy for change in bowel habits with both constipation and episodic diarrhea along with abdominal bloating.  Abdominal x-ray on 5/28 with moderate volume stool throughout the colon and was started on Linzess  for suspected constipation with overflow.  Otherwise no significant changes in clinical history since OV that day. Reports no clinical improvement with Linzess  72 mcg/day, so she has since stopped.   Past Medical History:  Diagnosis Date   Anxiety    Arthritis    Gallstone    Hypothyroidism    Kidney stones    Obesity    Palpitations    PONV (postoperative nausea and vomiting)     Past Surgical History:  Procedure Laterality Date   ANTERIOR CERVICAL DECOMP/DISCECTOMY FUSION N/A 06/07/2018   Procedure: C5-6, C-6-7 ANTERIOR CERVICAL DECOMPRESSION/DISCECTOMY FUSION, ALLOGRAFT, PLATE;  Surgeon: Barbarann Oneil BROCKS, MD;  Location: [facility] OR;  Service: Orthopedics;  Laterality: N/A;   BREAST REDUCTION SURGERY  03/2013   CESAREAN SECTION  2011, 2013   x2   CHOLECYSTECTOMY  09/2023   Dr Morna Pander at [facility] in [place]   DILATION AND CURETTAGE OF UTERUS     GANGLION CYST EXCISION     Right   mucinous tumor  2010   OOPHORECTOMY  2010   rt    Prior to Admission medications   Medication Sig Start Date End Date Taking? Authorizing Provider  FLUoxetine  (PROZAC )  10 MG capsule Take 10 mg by mouth daily. 07/23/23  Yes [provider]  levothyroxine  (SYNTHROID , LEVOTHROID) 50 MCG tablet Take 50 mcg by mouth daily before breakfast.   Yes [provider]  ondansetron  (ZOFRAN -ODT) 4 MG disintegrating tablet Take 1 tablet (4 mg total) by mouth every 6 (six) hours as needed for nausea. 09/06/23  Yes Pander Manuelita BROCKS, MD  traZODone  (DESYREL ) 50 MG tablet Take 25-50 mg by mouth at bedtime. 08/01/23  Yes [provider]  Semaglutide-Weight Management 2.4 MG/0.75ML SOAJ Inject 2.4 mg into the skin once a week.    [provider]    Current Outpatient Medications  Medication Sig Dispense Refill   FLUoxetine  (PROZAC ) 10 MG capsule Take 10 mg by mouth daily.     levothyroxine  (SYNTHROID , LEVOTHROID) 50 MCG tablet Take 50 mcg by mouth daily before breakfast.     ondansetron  (ZOFRAN -ODT) 4 MG disintegrating tablet Take 1 tablet (4 mg total) by mouth every 6 (six) hours as needed for nausea. 20 tablet 0   traZODone  (DESYREL ) 50 MG tablet Take 25-50 mg by mouth at bedtime.     Semaglutide-Weight Management 2.4 MG/0.75ML SOAJ Inject 2.4 mg into the skin once a week.     Current Facility-Administered Medications  Medication Dose Route Frequency Provider Last Rate Last Admin   0.9 %  sodium chloride  infusion  500 mL Intravenous Once Jameson Morrow V, DO        Allergies as of 01/15/2024   (No  Known Allergies)    Family History  Problem Relation Age of Onset   Diverticulitis Mother    Other Mother        stomach ulcers   Barrett's esophagus Maternal Grandmother    Colon cancer Neg Hx    Esophageal cancer Neg Hx     Social History   Socioeconomic History   Marital status: Married    Spouse name: Lang   Number of children: 2   Years of education: Not on file   Highest education level: Not on file  Occupational History   Occupation: Midwife  Tobacco Use   Smoking status: Never   Smokeless tobacco: Never   Vaping Use   Vaping status: Never Used  Substance and Sexual Activity   Alcohol  use: Not Currently   Drug use: Never   Sexual activity: Yes    Partners: Male    Birth control/protection: Surgical    Comment: husband vasectomy  Other Topics Concern   Not on file  Social History Narrative   Not on file   Social Drivers of Health   Financial Resource Strain: Not on file  Food Insecurity: No Food Insecurity (09/05/2023)   Hunger Vital Sign    Worried About Running Out of Food in the Last Year: Never true    Ran Out of Food in the Last Year: Never true  Transportation Needs: No Transportation Needs (09/05/2023)   PRAPARE - Administrator, Civil Service (Medical): No    Lack of Transportation (Non-Medical): No  Physical Activity: Not on file  Stress: Not on file  Social Connections: Not on file  Intimate Partner Violence: Not At Risk (09/05/2023)   Humiliation, Afraid, Rape, and Kick questionnaire    Fear of Current or Ex-Partner: No    Emotionally Abused: No    Physically Abused: No    Sexually Abused: No    Physical Exam: Vital signs in last 24 hours: @BP  (!) 85/53   Pulse 70   Temp 97.9 F (36.6 C) (Temporal)   Ht 5' 3 (1.6 m)   Wt 135 lb (61.2 kg)   LMP 01/12/2024   SpO2 100%   BMI 23.91 kg/m  GEN: NAD EYE: Sclerae anicteric ENT: MMM CV: Non-tachycardic Pulm: CTA b/l GI: Soft, NT/ND NEURO:  Alert & Oriented x 3   Sandor Flatter, DO  Gastroenterology   01/15/2024 9:15 AM

## 2024-01-16 ENCOUNTER — Telehealth: Payer: Self-pay | Admitting: *Deleted

## 2024-01-16 NOTE — Telephone Encounter (Signed)
 No answer on  follow up call. Left message.

## 2024-03-24 ENCOUNTER — Other Ambulatory Visit: Payer: Self-pay | Admitting: Gastroenterology

## 2024-03-24 ENCOUNTER — Encounter: Payer: Self-pay | Admitting: Gastroenterology

## 2024-03-24 ENCOUNTER — Ambulatory Visit (INDEPENDENT_AMBULATORY_CARE_PROVIDER_SITE_OTHER): Admitting: Gastroenterology

## 2024-03-24 VITALS — BP 138/74 | HR 102 | Ht 63.0 in | Wt 129.0 lb

## 2024-03-24 DIAGNOSIS — K581 Irritable bowel syndrome with constipation: Secondary | ICD-10-CM

## 2024-03-24 DIAGNOSIS — R109 Unspecified abdominal pain: Secondary | ICD-10-CM | POA: Diagnosis not present

## 2024-03-24 DIAGNOSIS — R140 Abdominal distension (gaseous): Secondary | ICD-10-CM

## 2024-03-24 MED ORDER — DICYCLOMINE HCL 10 MG PO CAPS: 10.0000 mg | ORAL_CAPSULE | Freq: Four times a day (QID) | ORAL | 5 refills | Status: AC | PRN

## 2024-03-24 MED ORDER — TRULANCE 3 MG PO TABS: 3.0000 mg | ORAL_TABLET | Freq: Every day | ORAL | 3 refills | Status: AC

## 2024-03-24 NOTE — Patient Instructions (Signed)
 _______________________________________________________  If your blood pressure at your visit was 140/90 or greater, please contact your primary care physician to follow up on this.  _______________________________________________________  If you are age 43 or older, your body mass index should be between 23-30. Your Body mass index is 22.85 kg/m. If this is out of the aforementioned range listed, please consider follow up with your Primary Care Provider.  If you are age 21 or younger, your body mass index should be between 19-25. Your Body mass index is 22.85 kg/m. If this is out of the aformentioned range listed, please consider follow up with your Primary Care Provider.   ________________________________________________________  The [facility] providers would like to encourage you to use MYCHART to communicate with providers for non-urgent requests or questions.  Due to long hold times on the telephone, sending your provider a message by [facility] may be a faster and more efficient way to get a response.  Please allow 48 business hours for a response.  Please remember that this is for non-urgent requests.  _______________________________________________________  [facility] is using a team-based approach to care.  Your team is made up of your doctor and two to three APPS. Our APPS (Nurse Practitioners and Physician Assistants) work with your physician to ensure care continuity for you. They are fully qualified to address your health concerns and develop a treatment plan. They communicate directly with your gastroenterologist to care for you. Seeing the Advanced Practice Practitioners on your physician's team can help you by facilitating care more promptly, often allowing for earlier appointments, access to diagnostic testing, procedures, and other specialty referrals.   We have sent the following medications to your pharmacy for you to pick up at your convenience:  START: Bentyl   10mg  one capsule every 6 hours as needed.  START: Trulance  3 mg one tablet daily  It was a pleasure to see you today!  Vito Cirigliano, D.O.

## 2024-03-24 NOTE — Progress Notes (Signed)
 Chief Complaint:    Constipation, procedure follow-up  GI History: 43 year old female who follows in the GI clinic for the following:    Has had GI issues for many years. Will have diarrhea x1 week with abdominal bloating and visible distension, nausea, then resolves. Has a few episodes/year.  Has not pinpointed any specific food triggers or other exacerbating features. - 07/2022: Normal CBC, CMP, TSH - 07/2022: Renal ultrasound: 8 mm right kidney stone, cholelithiasis - 08/02/2022: Initial appointment in the GI clinic.  Main issue was nausea, RUQ pain, bloating, superimposed on longstanding history of alternating bowel habits.  Placed referral to urology for nephrolithiasis and scheduled for CT and EGD - 08/03/2022: EGD: Normal esophagus, many fundic gland polyps (removed over 20), otherwise normal stomach, normal duodenum (gastric and duodenal path benign) - 08/09/2022: CT A/P: Normal GI tract and liver.  Cholelithiasis without cholecystitis.  5 mm right renal calculus.  If ongoing symptoms, recommended HIDA --09/20/22 follow-up in GI clinic for nausea, bloating, abdominal pain, and nausea --- 11/23/22 HIDA scan- normal gallbladder ejection fraction of 41% with normal, patent cystic and common bile ducts.    ---09/05/23 presented to ED with abdominal pain, nausea, and diarrhea. They proceeded with surgery due to her chronic issues and poorly controlled pain. US  abd distended gallbladder with stone.  ---09/06/23 cholecystectomy  ---09/25/23 follow-up with surgery, mentions pain with diarrhea. Surgery suspect she may have degree of IBS. --- 11/20/2023 follow-up in GI clinic with continued altered bowel habits, bloating.  Can go 1 week without BM and use magnesium citrate then intermittent diarrhea.  No improvement with dicyclomine .  Abdominal x-ray with moderate volume stool throughout the colon; trialed Linzess  72 mcg daily without response and recommended colonoscopy --- 01/15/2024 Colonoscopy-normal,  tortuous sigmoid colon, small internal hemorrhoids.  Increased Linzess  to 145 mcg daily, repeat colonoscopy in 10 years  HPI:     Patient is a 43 y.o. female presenting to the Gastroenterology Clinic for procedure follow-up.  Completed colonoscopy on 01/15/2024 which was only notable for small internal hemorrhoids, otherwise normal.  Patient no longer taking Linzess  due to nausea and diarrhea.  Took 145 mcg/day dose for approximately 1 week and stopped.  Main issue today is abdominal cramping, abdominal bloating, and constipation.     Review of systems:     No chest pain, no SOB, no fevers, no urinary sx   Past Medical History:  Diagnosis Date   Anxiety    Arthritis    Gallstone    Hypothyroidism    Kidney stones    Obesity    Palpitations    PONV (postoperative nausea and vomiting)     Patient's surgical history, family medical history, social history, medications and allergies were all reviewed in Epic    Current Outpatient Medications  Medication Sig Dispense Refill   FLUoxetine  (PROZAC ) 10 MG capsule Take 10 mg by mouth daily.     levothyroxine  (SYNTHROID , LEVOTHROID) 50 MCG tablet Take 50 mcg by mouth daily before breakfast.     ondansetron  (ZOFRAN -ODT) 4 MG disintegrating tablet Take 1 tablet (4 mg total) by mouth every 6 (six) hours as needed for nausea. 20 tablet 0   Semaglutide-Weight Management 2.4 MG/0.75ML SOAJ Inject 2.4 mg into the skin once a week.     traZODone  (DESYREL ) 50 MG tablet Take 25-50 mg by mouth at bedtime.     linaclotide  (LINZESS ) 145 MCG CAPS capsule Take 1 capsule (145 mcg total) by mouth daily before breakfast. 30 capsule 3  No current facility-administered medications for this visit.    Physical Exam:     BP 138/74   Pulse (!) 102   Ht 5' 3 (1.6 m)   Wt 129 lb (58.5 kg)   BMI 22.85 kg/m   GENERAL:  Pleasant female in NAD PSYCH: : Cooperative, normal affect NEURO: Alert and oriented x 3, no focal neurologic deficits   IMPRESSION  and PLAN:    1) IBS-C 2) Abdominal cramping 3) Abdominal bloating 43 year old female with longstanding history of IBS-C with episodic overflow diarrhea.  No response to Linzess  72 mcg/day.  Did not tolerate Linzess  at 145 mcg/day (nausea, diarrhea), albeit limited duration course.  - Will trial Trulance  3 mg daily - Increase daily water  intake with a goal of 64 ounces of water  daily - Continue high-fiber diet - Bentyl  10 mg as needed every 6 hours for abdominal cramping - If no response, will consider either sitz marker study or referral for ARM     RTC in 12 months or sooner as needed       Sandor LULLA Flatter ,DO, FACG 03/24/2024, 10:04 AM

## 2024-08-12 ENCOUNTER — Other Ambulatory Visit (HOSPITAL_COMMUNITY)

## 2024-08-14 ENCOUNTER — Ambulatory Visit (HOSPITAL_COMMUNITY): Admit: 2024-08-14 | Admitting: Obstetrics and Gynecology

## 2024-08-14 DIAGNOSIS — N939 Abnormal uterine and vaginal bleeding, unspecified: Secondary | ICD-10-CM

## 2024-08-14 SURGERY — HYSTERECTOMY, VAGINAL, LAPAROSCOPY-ASSISTED, WITH SALPINGECTOMY
Anesthesia: Choice | Laterality: Bilateral
